# Patient Record
Sex: Female | Born: 1950 | Race: Black or African American | Hispanic: No | Marital: Married | Smoking: Never smoker
Health system: Southern US, Community
[De-identification: ages and names within clinical notes are randomized; demographics above are authoritative.]

## PROBLEM LIST (undated history)

## (undated) DIAGNOSIS — M1990 Unspecified osteoarthritis, unspecified site: Secondary | ICD-10-CM

## (undated) DIAGNOSIS — I10 Essential (primary) hypertension: Secondary | ICD-10-CM

## (undated) HISTORY — DX: Essential (primary) hypertension: I10

## (undated) HISTORY — PX: CYSTECTOMY: SUR359

## (undated) HISTORY — DX: Unspecified osteoarthritis, unspecified site: M19.90

---

## 2003-01-16 ENCOUNTER — Emergency Department (HOSPITAL_COMMUNITY): Admission: EM | Admit: 2003-01-16 | Discharge: 2003-01-16 | Payer: Self-pay | Admitting: Emergency Medicine

## 2005-03-19 ENCOUNTER — Encounter: Admission: RE | Admit: 2005-03-19 | Discharge: 2005-03-19 | Payer: Self-pay | Admitting: *Deleted

## 2012-05-08 ENCOUNTER — Telehealth: Payer: Self-pay | Admitting: Medical Oncology

## 2012-05-08 NOTE — Telephone Encounter (Signed)
Wrong chart

## 2019-03-11 LAB — COLOGUARD

## 2019-08-12 ENCOUNTER — Other Ambulatory Visit: Payer: Self-pay | Admitting: Physician Assistant

## 2019-08-12 ENCOUNTER — Other Ambulatory Visit: Payer: Self-pay

## 2019-08-13 ENCOUNTER — Ambulatory Visit (INDEPENDENT_AMBULATORY_CARE_PROVIDER_SITE_OTHER): Payer: Medicare HMO | Admitting: Physician Assistant

## 2019-08-13 ENCOUNTER — Encounter (INDEPENDENT_AMBULATORY_CARE_PROVIDER_SITE_OTHER): Payer: Self-pay

## 2019-08-13 ENCOUNTER — Encounter: Payer: Self-pay | Admitting: Physician Assistant

## 2019-08-13 VITALS — BP 103/70 | HR 75 | Temp 97.8°F | Ht 65.0 in | Wt 215.0 lb

## 2019-08-13 DIAGNOSIS — Z23 Encounter for immunization: Secondary | ICD-10-CM

## 2019-08-13 DIAGNOSIS — I10 Essential (primary) hypertension: Secondary | ICD-10-CM | POA: Diagnosis not present

## 2019-08-13 MED ORDER — LISINOPRIL 40 MG PO TABS: 40.0000 mg | ORAL_TABLET | Freq: Every day | ORAL | 1 refills | Status: DC

## 2019-08-13 MED ORDER — FUROSEMIDE 20 MG PO TABS: ORAL_TABLET | ORAL | 1 refills | Status: DC

## 2019-08-13 NOTE — Patient Instructions (Signed)

## 2019-08-13 NOTE — Progress Notes (Signed)
BP 103/70   Pulse 75   Temp 97.8 F (36.6 C) (Temporal)   Ht 5' 5"  (1.651 m)   Wt 215 lb (97.5 kg)   SpO2 98%   BMI 35.78 kg/m    Subjective:    Patient ID: Maria Hart, female    DOB: Feb 20, 1951, 68 y.o.   MRN: [id number]  HPI: Maria Hart is a 68 y.o. female presenting on 08/13/2019 for New Patient (Initial Visit) (Dr. Melina Copa)   Patient comes in with a new patient to be established.  She is a longtime patient of Dr. Melina Copa.  Her only medical condition of significance is hypertension.  She notes that she does have some arthritis in her knees that starts to bother her.  She has never had to take prescription medication for it.  She recently did go to the emergency room because of swelling in her legs, right greater than left.  She had had a couple of weeks where she was traveling a lot and riding in a car, sitting at the hospital, not eating well and not able to drink water like she normally does.  And she feels like this is probably what caused the swelling to occur.  They did give her Lasix at the emergency room and she is taking it daily since then.  Her blood pressure was slightly low today.  I have discussed that we need to change her medications around.  And rather than her taking lisinopril with hydrochlorothiazide in it.  We are going to have her use lisinopril alone with Lasix 20 mg she will take 1 daily and occasionally to when she has an episode of edema.  We will also have labs performed today.  We are getting her records in order to see the status of her vaccine record, labs, Pap result.  She did have a Pap performed earlier this year we can see that in the office note but do not have that result.  She also had a Cologuard test and it was negative in May 2020.  She will not be due another Cologuard until 3 years. Past Medical History:  Diagnosis Date  . Arthritis   . Hypertension    Relevant past medical, surgical, family and social history reviewed and updated  as indicated. Interim medical history since our last visit reviewed. Allergies and medications reviewed and updated. DATA REVIEWED: CHART IN EPIC  Family History reviewed for pertinent findings.  Review of Systems  Constitutional: Negative.   HENT: Negative.   Eyes: Negative.   Respiratory: Negative.   Gastrointestinal: Negative.   Genitourinary: Negative.   Musculoskeletal: Positive for arthralgias.    Allergies as of 08/13/2019   No Known Allergies     Medication List       Accurate as of August 13, 2019 10:54 AM. If you have any questions, ask your nurse or doctor.        STOP taking these medications   lisinopril-hydrochlorothiazide 20-12.5 MG tablet Commonly known as: ZESTORETIC Stopped by: Terald Sleeper, PA-C     TAKE these medications   furosemide 20 MG tablet Commonly known as: LASIX TAKE 1-2 TABLET BY MOUTH ONCE DAILY AS NEEDED FOR EDEMA What changed: See the new instructions. Changed by: Terald Sleeper, PA-C   lisinopril 40 MG tablet Commonly known as: ZESTRIL Take 1 tablet (40 mg total) by mouth daily. Started by: Terald Sleeper, PA-C          Objective:  BP 103/70   Pulse 75   Temp 97.8 F (36.6 C) (Temporal)   Ht 5' 5"  (1.651 m)   Wt 215 lb (97.5 kg)   SpO2 98%   BMI 35.78 kg/m   No Known Allergies  Wt Readings from Last 3 Encounters:  08/13/19 215 lb (97.5 kg)    Physical Exam Constitutional:      General: She is not in acute distress.    Appearance: Normal appearance. She is well-developed.  HENT:     Head: Normocephalic and atraumatic.  Cardiovascular:     Rate and Rhythm: Normal rate.  Pulmonary:     Effort: Pulmonary effort is normal.  Skin:    General: Skin is warm and dry.     Findings: No rash.  Neurological:     Mental Status: She is alert and oriented to person, place, and time.     Deep Tendon Reflexes: Reflexes are normal and symmetric.     No results found for this or any previous visit.    Assessment &  Plan:   1. Essential hypertension Lisinopril 40 mg 1 daily Furosemide 20 mg 1-2 daily - CMP14+EGFR  2. Need for immunization against influenza - Flu Vaccine QUAD High Dose(Fluad)   Continue all other maintenance medications as listed above.  Follow up plan: Return in about 6 months (around 02/11/2020).  Educational handout given for Mellette PA-C [address]  [place], [place] [address] [phone]   08/13/2019, 10:54 AM

## 2019-08-14 ENCOUNTER — Other Ambulatory Visit: Payer: Self-pay | Admitting: Physician Assistant

## 2019-08-14 DIAGNOSIS — R399 Unspecified symptoms and signs involving the genitourinary system: Secondary | ICD-10-CM

## 2019-08-14 LAB — CMP14+EGFR
ALT: 25 IU/L (ref 0–32)
AST: 22 IU/L (ref 0–40)
Albumin/Globulin Ratio: 1.7 (ref 1.2–2.2)
Albumin: 4.4 g/dL (ref 3.8–4.8)
Alkaline Phosphatase: 69 IU/L (ref 39–117)
BUN/Creatinine Ratio: 18 (ref 12–28)
BUN: 24 mg/dL (ref 8–27)
Bilirubin Total: 0.7 mg/dL (ref 0.0–1.2)
CO2: 24 mmol/L (ref 20–29)
Calcium: 9.5 mg/dL (ref 8.7–10.3)
Chloride: 102 mmol/L (ref 96–106)
Creatinine, Ser: 1.33 mg/dL — ABNORMAL HIGH (ref 0.57–1.00)
GFR calc Af Amer: 48 mL/min/{1.73_m2} — ABNORMAL LOW (ref >59)
GFR calc non Af Amer: 41 mL/min/{1.73_m2} — ABNORMAL LOW (ref >59)
Globulin, Total: 2.6 g/dL (ref 1.5–4.5)
Glucose: 99 mg/dL (ref 65–99)
Potassium: 4.1 mmol/L (ref 3.5–5.2)
Sodium: 140 mmol/L (ref 134–144)
Total Protein: 7 g/dL (ref 6.0–8.5)

## 2019-09-23 ENCOUNTER — Other Ambulatory Visit: Payer: Self-pay

## 2019-09-24 ENCOUNTER — Other Ambulatory Visit: Payer: Medicare HMO

## 2019-09-24 DIAGNOSIS — R399 Unspecified symptoms and signs involving the genitourinary system: Secondary | ICD-10-CM

## 2019-09-25 LAB — MICROALBUMIN / CREATININE URINE RATIO
Creatinine, Urine: 69 mg/dL
Microalb/Creat Ratio: <4 mg/g creat (ref 0–29)
Microalbumin, Urine: <3 ug/mL

## 2019-09-25 LAB — CMP14+EGFR
ALT: 19 IU/L (ref 0–32)
AST: 21 IU/L (ref 0–40)
Albumin/Globulin Ratio: 1.7 (ref 1.2–2.2)
Albumin: 4.1 g/dL (ref 3.8–4.8)
Alkaline Phosphatase: 58 IU/L (ref 39–117)
BUN/Creatinine Ratio: 14 (ref 12–28)
BUN: 16 mg/dL (ref 8–27)
Bilirubin Total: 0.6 mg/dL (ref 0.0–1.2)
CO2: 24 mmol/L (ref 20–29)
Calcium: 9.5 mg/dL (ref 8.7–10.3)
Chloride: 106 mmol/L (ref 96–106)
Creatinine, Ser: 1.14 mg/dL — ABNORMAL HIGH (ref 0.57–1.00)
GFR calc Af Amer: 57 mL/min/{1.73_m2} — ABNORMAL LOW (ref >59)
GFR calc non Af Amer: 50 mL/min/{1.73_m2} — ABNORMAL LOW (ref >59)
Globulin, Total: 2.4 g/dL (ref 1.5–4.5)
Glucose: 97 mg/dL (ref 65–99)
Potassium: 4.5 mmol/L (ref 3.5–5.2)
Sodium: 143 mmol/L (ref 134–144)
Total Protein: 6.5 g/dL (ref 6.0–8.5)

## 2019-12-14 ENCOUNTER — Ambulatory Visit: Payer: Medicare HMO | Attending: Internal Medicine

## 2019-12-14 ENCOUNTER — Other Ambulatory Visit: Payer: Self-pay

## 2019-12-14 DIAGNOSIS — Z23 Encounter for immunization: Secondary | ICD-10-CM | POA: Insufficient documentation

## 2019-12-14 NOTE — Progress Notes (Signed)
   Covid-19 Vaccination Clinic  Name:  Maria Hart    MRN: [id number] DOB: [birth year]  12/14/2019  Maria Hart was observed post Covid-19 immunization for 15 minutes without incidence. She was provided with Vaccine Information Sheet and instruction to access the V-Safe system.   Maria Hart was instructed to call 911 with any severe reactions post vaccine: Marland Kitchen Difficulty breathing  . Swelling of your face and throat  . A fast heartbeat  . A bad rash all over your body  . Dizziness and weakness    Immunizations Administered    Name Date Dose VIS Date Route   Moderna COVID-19 Vaccine 12/14/2019 12:25 PM 0.5 mL 10/01/2019 Intramuscular   Manufacturer: Moderna   Lot: [id number]   NDC: 22840-698-61

## 2020-01-11 ENCOUNTER — Ambulatory Visit: Payer: Medicare HMO | Attending: Internal Medicine

## 2020-01-11 DIAGNOSIS — Z23 Encounter for immunization: Secondary | ICD-10-CM

## 2020-01-11 NOTE — Progress Notes (Signed)
   Covid-19 Vaccination Clinic  Name:  Maria Hart    MRN: [id number] DOB: 1951-10-30  01/11/2020  Maria Hart was observed post Covid-19 immunization for 15 minutes without incident. She was provided with Vaccine Information Sheet and instruction to access the V-Safe system.   Maria Hart was instructed to call 911 with any severe reactions post vaccine: Marland Kitchen Difficulty breathing  . Swelling of face and throat  . A fast heartbeat  . A bad rash all over body  . Dizziness and weakness   Immunizations Administered    Name Date Dose VIS Date Route   Moderna COVID-19 Vaccine 01/11/2020 10:18 AM 0.5 mL 10/01/2019 Intramuscular   Manufacturer: Moderna   Lot: [id number]   NDC: 29244-628-63

## 2020-02-11 ENCOUNTER — Ambulatory Visit: Payer: Medicare HMO | Admitting: Physician Assistant

## 2020-02-21 ENCOUNTER — Ambulatory Visit: Payer: Medicare HMO

## 2020-02-24 ENCOUNTER — Ambulatory Visit (INDEPENDENT_AMBULATORY_CARE_PROVIDER_SITE_OTHER): Payer: Medicare HMO | Admitting: *Deleted

## 2020-02-24 DIAGNOSIS — Z0000 Encounter for general adult medical examination without abnormal findings: Secondary | ICD-10-CM | POA: Diagnosis not present

## 2020-02-24 NOTE — Progress Notes (Signed)
MEDICARE ANNUAL WELLNESS VISIT  02/24/2020  Telephone Visit Disclaimer This Medicare AWV was conducted by telephone due to national recommendations for restrictions regarding the COVID-19 Pandemic (e.g. social distancing).  I verified, using two identifiers, that I am speaking with Maria Hart or their authorized healthcare agent. I discussed the limitations, risks, security, and privacy concerns of performing an evaluation and management service by telephone and the potential availability of an in-person appointment in the future. The patient expressed understanding and agreed to proceed.   Subjective:  Maria Hart is a 69 y.o. female patient of Maria Sleeper, PA-C who had a Medicare Annual Wellness Visit today via telephone. Maria Hart is Retired and lives with their spouse. she has 1 children. she reports that she is socially active and does interact with friends/family regularly. she is minimally physically active and enjoys going to the beach, and cleaning.  Patient Care Team: Maria Hart as PCP - General (Physician Assistant)  Advanced Directives 02/24/2020  Does Patient Have a Medical Advance Directive? Yes  Type of Advance Directive Living will    Hospital Utilization Over the Past 12 Months: # of hospitalizations or ER visits: 0 # of surgeries: 0  Review of Systems    Patient reports that her overall health is unchanged compared to last year.  Right ankle pain and swelling  Patient Reported Readings (BP, Pulse, CBG, Weight, etc) none  Pain Assessment Pain : 0-10 Pain Score: 5  Pain Type: Acute pain Pain Location: Ankle Pain Orientation: Right Pain Descriptors / Indicators: Sore Pain Onset: In the past 7 days Pain Frequency: Occasional     Current Medications & Allergies (verified) Allergies as of 02/24/2020   No Known Allergies     Medication List       Accurate as of February 24, 2020  1:46 PM. If you have any questions, ask your nurse or  doctor.        furosemide 20 MG tablet Commonly known as: LASIX TAKE 1-2 TABLET BY MOUTH ONCE DAILY AS NEEDED FOR EDEMA   lisinopril 40 MG tablet Commonly known as: ZESTRIL Take 1 tablet (40 mg total) by mouth daily.       History (reviewed): Past Medical History:  Diagnosis Date  . Arthritis   . Hypertension    Past Surgical History:  Procedure Laterality Date  . CYSTECTOMY     Family History  Problem Relation Age of Onset  . Cancer Mother        Brain   . Arthritis Father   . Diabetes Father   . Hypertension Sister   . Hypertension Sister    Social History   Socioeconomic History  . Marital status: Married    Spouse name: Not on file  . Number of children: 1  . Years of education: Not on file  . Highest education level: High school graduate  Occupational History  . Occupation: Retired  Tobacco Use  . Smoking status: Never Smoker  . Smokeless tobacco: Never Used  Substance and Sexual Activity  . Alcohol use: Never  . Drug use: Never  . Sexual activity: Not Currently  Other Topics Concern  . Not on file  Social History Narrative  . Not on file   Social Determinants of Health   Financial Resource Strain:   . Difficulty of Paying Living Expenses:   Food Insecurity:   . Worried About Charity fundraiser in the Last Year:   . [employer] of  Food in the Last Year:   Transportation Needs:   . Freight forwarder (Medical):   Marland Kitchen Lack of Transportation (Non-Medical):   Physical Activity:   . Days of Exercise per Week:   . Minutes of Exercise per Session:   Stress:   . Feeling of Stress :   Social Connections:   . Frequency of Communication with Friends and Family:   . Frequency of Social Gatherings with Friends and Family:   . Attends Religious Services:   . Active Member of Clubs or Organizations:   . Attends Banker Meetings:   Marland Kitchen Marital Status:     Activities of Daily Living In your present state of health, do you have any  difficulty performing the following activities: 02/24/2020  Hearing? Y  Comment At times seems to have trouble  Vision? N  Comment Wears reading glasses  Difficulty concentrating or making decisions? N  Walking or climbing stairs? N  Dressing or bathing? N  Doing errands, shopping? N  Preparing Food and eating ? N  Using the Toilet? N  In the past six months, have you accidently leaked urine? N  Do you have problems with loss of bowel control? N  Managing your Medications? N  Managing your Finances? N  Housekeeping or managing your Housekeeping? N  Some recent data might be hidden    Patient Education/ Literacy How often do you need to have someone help you when you read instructions, pamphlets, or other written materials from your doctor or pharmacy?: 1 - Never What is the last grade level you completed in school?: 12  Exercise Current Exercise Habits: The patient does not participate in regular exercise at present  Diet Patient reports consuming 2 meals a day and 2 snack(s) a day Patient reports that her primary diet is: Regular Patient reports that she does have regular access to food.   Depression Screen PHQ 2/9 Scores 02/24/2020 08/13/2019  PHQ - 2 Score 0 0     Fall Risk Fall Risk  02/24/2020 08/13/2019  Falls in the past year? 0 0     Objective:  Maria Hart seemed alert and oriented and she participated appropriately during our telephone visit.  Blood Pressure Weight BMI  BP Readings from Last 3 Encounters:  08/13/19 103/70   Wt Readings from Last 3 Encounters:  08/13/19 215 lb (97.5 kg)   BMI Readings from Last 1 Encounters:  08/13/19 35.78 kg/m    *Unable to obtain current vital signs, weight, and BMI due to telephone visit type  Hearing/Vision  . Maria Hart did not seem to have difficulty with hearing/understanding during the telephone conversation . Reports that she has had a formal eye exam by an eye care professional within the past year . Reports  that she has not had a formal hearing evaluation within the past year *Unable to fully assess hearing and vision during telephone visit type  Cognitive Function: 6CIT Screen 02/24/2020  What Year? 0 points  What month? 0 points  What time? 0 points  Count back from 20 0 points  Months in reverse 0 points  Repeat phrase 2 points  Total Score 2   (Normal:0-7, Significant for Dysfunction: >8)  Normal Cognitive Function Screening: Yes   Immunization & Health Maintenance Record Immunization History  Administered Date(s) Administered  . Fluad Quad(high Dose 65+) 08/13/2019  . Moderna SARS-COVID-2 Vaccination 12/14/2019, 01/11/2020    Health Maintenance  Topic Date Due  . Hepatitis C Screening  Never done  .  TETANUS/TDAP  Never done  . MAMMOGRAM  Never done  . DEXA SCAN  Never done  . PNA vac Low Risk Adult (1 of 2 - PCV13) Never done  . INFLUENZA VACCINE  05/31/2020  . Fecal DNA (Cologuard)  03/06/2022  . COVID-19 Vaccine  Completed       Assessment  This is a routine wellness examination for Maria Hart.  Health Maintenance: Due or Overdue Health Maintenance Due  Topic Date Due  . Hepatitis C Screening  Never done  . TETANUS/TDAP  Never done  . MAMMOGRAM  Never done  . DEXA SCAN  Never done  . PNA vac Low Risk Adult (1 of 2 - PCV13) Never done    Maria Hart does not need a referral for Community Assistance: Care Management:   no Social Work:    no Prescription Assistance:  no Nutrition/Diabetes Education:  no   Plan:  Personalized Goals Goals Addressed            This Visit's Progress   . AWV       02/24/2020 AWV Goal: Exercise for General Health   Patient will verbalize understanding of the benefits of increased physical activity:  Exercising regularly is important. It will improve your overall fitness, flexibility, and endurance.  Regular exercise also will improve your overall health. It can help you control your weight, reduce stress,  and improve your bone density.  Over the next year, patient will increase physical activity as tolerated with a goal of at least 150 minutes of moderate physical activity per week.   You can tell that you are exercising at a moderate intensity if your heart starts beating faster and you start breathing faster but can still hold a conversation.  Moderate-intensity exercise ideas include:  Walking 1 mile (1.6 km) in about 15 minutes  Biking  Hiking  Golfing  Dancing  Water aerobics  Patient will verbalize understanding of everyday activities that increase physical activity by providing examples like the following: ? Yard work, such as: ? Pushing a Surveyor, mining ? Raking and bagging leaves ? Washing your car ? Pushing a stroller ? Shoveling snow ? Gardening ? Washing windows or floors  Patient will be able to explain general safety guidelines for exercising:   Before you start a new exercise program, talk with your health care provider.  Do not exercise so much that you hurt yourself, feel dizzy, or get very short of breath.  Wear comfortable clothes and wear shoes with good support.  Drink plenty of water while you exercise to prevent dehydration or heat stroke.  Work out until your breathing and your heartbeat get faster.       Personalized Health Maintenance & Screening Recommendations  Pneumococcal vaccine  Td vaccine Screening mammography Bone densitometry screening  Lung Cancer Screening Recommended: no (Low Dose CT Chest recommended if Age 19-80 years, 30 pack-year currently smoking OR have quit w/in past 15 years) Hepatitis C Screening recommended: no HIV Screening recommended: no  Advanced Directives: Written information was not prepared per patient's request.  Referrals & Orders No orders of the defined types were placed in this encounter.   Follow-up Plan . Follow-up with Dr. Nadine Counts as planned . We will discuss tdap and pneumonia vaccines at  appointment on Wednesday . AVS printed and mailed to patient     I have personally reviewed and noted the following in the patient's chart:   . Medical and social history . Use of alcohol,  tobacco or illicit drugs  . Current medications and supplements . Functional ability and status . Nutritional status . Physical activity . Advanced directives . List of other physicians . Hospitalizations, surgeries, and ER visits in previous 12 months . Vitals . Screenings to include cognitive, depression, and falls . Referrals and appointments  In addition, I have reviewed and discussed with Maria Hart certain preventive protocols, quality metrics, and best practice recommendations. A written personalized care plan for preventive services as well as general preventive health recommendations is available and can be mailed to the patient at her request.      Sherron Monday  02/24/2020

## 2020-02-25 NOTE — Patient Instructions (Signed)
  MEDICARE ANNUAL WELLNESS VISIT Health Maintenance Summary and Written Plan of Care  Maria Hart ,  Thank you for allowing me to perform your Medicare Annual Wellness Visit and for your ongoing commitment to your health.   Health Maintenance & Immunization History Health Maintenance  Topic Date Due  . Hepatitis C Screening  Never done  . TETANUS/TDAP  Never done  . MAMMOGRAM  Never done  . DEXA SCAN  Never done  . PNA vac Low Risk Adult (1 of 2 - PCV13) Never done  . INFLUENZA VACCINE  05/31/2020  . Fecal DNA (Cologuard)  03/06/2022  . COVID-19 Vaccine  Completed   Immunization History  Administered Date(s) Administered  . Fluad Quad(high Dose 65+) 08/13/2019  . Moderna SARS-COVID-2 Vaccination 12/14/2019, 01/11/2020    These are the patient goals that we discussed: Goals Addressed            This Visit's Progress   . AWV       02/24/2020 AWV Goal: Exercise for General Health   Patient will verbalize understanding of the benefits of increased physical activity:  Exercising regularly is important. It will improve your overall fitness, flexibility, and endurance.  Regular exercise also will improve your overall health. It can help you control your weight, reduce stress, and improve your bone density.  Over the next year, patient will increase physical activity as tolerated with a goal of at least 150 minutes of moderate physical activity per week.   You can tell that you are exercising at a moderate intensity if your heart starts beating faster and you start breathing faster but can still hold a conversation.  Moderate-intensity exercise ideas include:  Walking 1 mile (1.6 km) in about 15 minutes  Biking  Hiking  Golfing  Dancing  Water aerobics  Patient will verbalize understanding of everyday activities that increase physical activity by providing examples like the following: ? Yard work, such as: ? Pushing a Surveyor, mining ? Raking and bagging  leaves ? Washing your car ? Pushing a stroller ? Shoveling snow ? Gardening ? Washing windows or floors  Patient will be able to explain general safety guidelines for exercising:   Before you start a new exercise program, talk with your health care provider.  Do not exercise so much that you hurt yourself, feel dizzy, or get very short of breath.  Wear comfortable clothes and wear shoes with good support.  Drink plenty of water while you exercise to prevent dehydration or heat stroke.  Work out until your breathing and your heartbeat get faster.         This is a list of Health Maintenance Items that are overdue or due now: Health Maintenance Due  Topic Date Due  . Hepatitis C Screening  Never done  . TETANUS/TDAP  Never done  . MAMMOGRAM  Never done  . DEXA SCAN  Never done  . PNA vac Low Risk Adult (1 of 2 - PCV13) Never done     Orders/Referrals Placed Today: No orders of the defined types were placed in this encounter.  (Contact our referral department at [phone] if you have not spoken with someone about your referral appointment within the next 5 days)    Follow-up Plan Follow up with Dr. Nadine Counts as plannded We will discuss tdap and pneumonia vaccine at appointment.

## 2020-02-26 ENCOUNTER — Other Ambulatory Visit: Payer: Self-pay

## 2020-02-26 ENCOUNTER — Encounter: Payer: Self-pay | Admitting: Family Medicine

## 2020-02-26 ENCOUNTER — Ambulatory Visit (INDEPENDENT_AMBULATORY_CARE_PROVIDER_SITE_OTHER): Payer: Medicare HMO | Admitting: Family Medicine

## 2020-02-26 VITALS — BP 158/90 | HR 90 | Temp 98.0°F | Ht 65.0 in | Wt 217.0 lb

## 2020-02-26 DIAGNOSIS — I129 Hypertensive chronic kidney disease with stage 1 through stage 4 chronic kidney disease, or unspecified chronic kidney disease: Secondary | ICD-10-CM | POA: Diagnosis not present

## 2020-02-26 DIAGNOSIS — N183 Chronic kidney disease, stage 3 unspecified: Secondary | ICD-10-CM

## 2020-02-26 DIAGNOSIS — Z130 Encounter for screening for diseases of the blood and blood-forming organs and certain disorders involving the immune mechanism: Secondary | ICD-10-CM | POA: Diagnosis not present

## 2020-02-26 DIAGNOSIS — Z7689 Persons encountering health services in other specified circumstances: Secondary | ICD-10-CM

## 2020-02-26 DIAGNOSIS — Z1159 Encounter for screening for other viral diseases: Secondary | ICD-10-CM

## 2020-02-26 DIAGNOSIS — E8940 Asymptomatic postprocedural ovarian failure: Secondary | ICD-10-CM

## 2020-02-26 NOTE — Patient Instructions (Signed)
I may add a water pill called hydrochlorothiazide pending your lab. Come back in 1 week for BP check with nurse   DASH Eating Plan DASH stands for "Dietary Approaches to Stop Hypertension." The DASH eating plan is a healthy eating plan that has been shown to reduce high blood pressure (hypertension). It may also reduce your risk for type 2 diabetes, heart disease, and stroke. The DASH eating plan may also help with weight loss. What are tips for following this plan?  General guidelines  Avoid eating more than 2,300 mg (milligrams) of salt (sodium) a day. If you have hypertension, you may need to reduce your sodium intake to 1,500 mg a day.  Limit alcohol intake to no more than 1 drink a day for nonpregnant women and 2 drinks a day for men. One drink equals 12 oz of beer, 5 oz of wine, or 1 oz of hard liquor.  Work with your health care provider to maintain a healthy body weight or to lose weight. Ask what an ideal weight is for you.  Get at least 30 minutes of exercise that causes your heart to beat faster (aerobic exercise) most days of the week. Activities may include walking, swimming, or biking.  Work with your health care provider or diet and nutrition specialist (dietitian) to adjust your eating plan to your individual calorie needs. Reading food labels   Check food labels for the amount of sodium per serving. Choose foods with less than 5 percent of the Daily Value of sodium. Generally, foods with less than 300 mg of sodium per serving fit into this eating plan.  To find whole grains, look for the word "whole" as the first word in the ingredient list. Shopping  Buy products labeled as "low-sodium" or "no salt added."  Buy fresh foods. Avoid canned foods and premade or frozen meals. Cooking  Avoid adding salt when cooking. Use salt-free seasonings or herbs instead of table salt or sea salt. Check with your health care provider or pharmacist before using salt substitutes.  Do  not fry foods. Cook foods using healthy methods such as baking, boiling, grilling, and broiling instead.  Cook with heart-healthy oils, such as olive, canola, soybean, or sunflower oil. Meal planning  Eat a balanced diet that includes: ? 5 or more servings of fruits and vegetables each day. At each meal, try to fill half of your plate with fruits and vegetables. ? Up to 6-8 servings of whole grains each day. ? Less than 6 oz of lean meat, poultry, or fish each day. A 3-oz serving of meat is about the same size as a deck of cards. One egg equals 1 oz. ? 2 servings of low-fat dairy each day. ? A serving of nuts, seeds, or beans 5 times each week. ? Heart-healthy fats. Healthy fats called Omega-3 fatty acids are found in foods such as flaxseeds and coldwater fish, like sardines, salmon, and mackerel.  Limit how much you eat of the following: ? Canned or prepackaged foods. ? Food that is high in trans fat, such as fried foods. ? Food that is high in saturated fat, such as fatty meat. ? Sweets, desserts, sugary drinks, and other foods with added sugar. ? Full-fat dairy products.  Do not salt foods before eating.  Try to eat at least 2 vegetarian meals each week.  Eat more home-cooked food and less restaurant, buffet, and fast food.  When eating at a restaurant, ask that your food be prepared with less salt  or no salt, if possible. What foods are recommended? The items listed may not be a complete list. Talk with your dietitian about what dietary choices are best for you. Grains Whole-grain or whole-wheat bread. Whole-grain or whole-wheat pasta. Brown rice. Maria Hart. Bulgur. Whole-grain and low-sodium cereals. Pita bread. Low-fat, low-sodium crackers. Whole-wheat flour tortillas. Vegetables Fresh or frozen vegetables (raw, steamed, roasted, or grilled). Low-sodium or reduced-sodium tomato and vegetable juice. Low-sodium or reduced-sodium tomato sauce and tomato paste. Low-sodium or  reduced-sodium canned vegetables. Fruits All fresh, dried, or frozen fruit. Canned fruit in natural juice (without added sugar). Meat and other protein foods Skinless chicken or [place]. Ground chicken or [place]. Pork with fat trimmed off. Fish and seafood. Egg whites. Dried beans, peas, or lentils. Unsalted nuts, nut butters, and seeds. Unsalted canned beans. Lean cuts of beef with fat trimmed off. Low-sodium, lean deli meat. Dairy Low-fat (1%) or fat-free (skim) milk. Fat-free, low-fat, or reduced-fat cheeses. Nonfat, low-sodium ricotta or cottage cheese. Low-fat or nonfat yogurt. Low-fat, low-sodium cheese. Fats and oils Soft margarine without trans fats. Vegetable oil. Low-fat, reduced-fat, or light mayonnaise and salad dressings (reduced-sodium). Canola, safflower, olive, soybean, and sunflower oils. Avocado. Seasoning and other foods Herbs. Spices. Seasoning mixes without salt. Unsalted popcorn and pretzels. Fat-free sweets. What foods are not recommended? The items listed may not be a complete list. Talk with your dietitian about what dietary choices are best for you. Grains Baked goods made with fat, such as croissants, muffins, or some breads. Dry pasta or rice meal packs. Vegetables Creamed or fried vegetables. Vegetables in a cheese sauce. Regular canned vegetables (not low-sodium or reduced-sodium). Regular canned tomato sauce and paste (not low-sodium or reduced-sodium). Regular tomato and vegetable juice (not low-sodium or reduced-sodium). Maria Hart. Olives. Fruits Canned fruit in a light or heavy syrup. Fried fruit. Fruit in cream or butter sauce. Meat and other protein foods Fatty cuts of meat. Ribs. Fried meat. Maria Hart. Sausage. Bologna and other processed lunch meats. Salami. Fatback. Hotdogs. Bratwurst. Salted nuts and seeds. Canned beans with added salt. Canned or smoked fish. Whole eggs or egg yolks. Chicken or [place] with skin. Dairy Whole or 2% milk, cream, and half-and-half.  Whole or full-fat cream cheese. Whole-fat or sweetened yogurt. Full-fat cheese. Nondairy creamers. Whipped toppings. Processed cheese and cheese spreads. Fats and oils Butter. Stick margarine. Lard. Shortening. Ghee. Bacon fat. Tropical oils, such as coconut, palm kernel, or palm oil. Seasoning and other foods Salted popcorn and pretzels. Onion salt, garlic salt, seasoned salt, table salt, and sea salt. Worcestershire sauce. Tartar sauce. Barbecue sauce. Teriyaki sauce. Soy sauce, including reduced-sodium. Steak sauce. Canned and packaged gravies. Fish sauce. Oyster sauce. Cocktail sauce. Horseradish that you find on the shelf. Ketchup. Mustard. Meat flavorings and tenderizers. Bouillon cubes. Hot sauce and Tabasco sauce. Premade or packaged marinades. Premade or packaged taco seasonings. Relishes. Regular salad dressings. Where to find more information:  National Heart, Lung, and [place]: https://wilson-eaton.com/  American Heart Association: www.heart.org Summary  The DASH eating plan is a healthy eating plan that has been shown to reduce high blood pressure (hypertension). It may also reduce your risk for type 2 diabetes, heart disease, and stroke.  With the DASH eating plan, you should limit salt (sodium) intake to 2,300 mg a day. If you have hypertension, you may need to reduce your sodium intake to 1,500 mg a day.  When on the DASH eating plan, aim to eat more fresh fruits and vegetables, whole grains, lean proteins, low-fat dairy,  and heart-healthy fats.  Work with your health care provider or diet and nutrition specialist (dietitian) to adjust your eating plan to your individual calorie needs. This information is not intended to replace advice given to you by your health care provider. Make sure you discuss any questions you have with your health care provider. Document Revised: 09/29/2017 Document Reviewed: 10/10/2016 Elsevier Patient Education  2020 [employer].

## 2020-02-26 NOTE — Progress Notes (Signed)
Subjective: CC: est care, HTN, CKD3 PCP: [name], DO ZOX:WRUEAV Z Dimaio is a 69 y.o. female presenting to clinic today for:  1. HTN w/ CKD3 Patient reports compliance with Lisinopril 40mg  daily.  No chest pain, shortness of breath, dizziness or visual disturbance.  She does have chronic right greater than left lower extremity edema that resolves each morning but recurs each evening.  She takes Lasix intermittently for this but is trying to come off of this medicine as it did cause an AKI in the past.  She also does not find it as effective as it used to be.  Never used hydrochlorothiazide.  Blood pressures are typically this high.  She does admit to eating quite a bit of salt and would like recommendations on how to make dietary changes for improved blood pressure and weight.   ROS: Per HPI  No Known Allergies Past Medical History:  Diagnosis Date  . Arthritis   . Hypertension     Current Outpatient Medications:  .  furosemide (LASIX) 20 MG tablet, TAKE 1-2 TABLET BY MOUTH ONCE DAILY AS NEEDED FOR EDEMA, Disp: 180 tablet, Rfl: 1 .  lisinopril (ZESTRIL) 40 MG tablet, Take 1 tablet (40 mg total) by mouth daily., Disp: 90 tablet, Rfl: 1 Social History   Socioeconomic History  . Marital status: Married    Spouse name: Not on file  . Number of children: 1  . Years of education: Not on file  . Highest education level: High school graduate  Occupational History  . Occupation: Retired  Tobacco Use  . Smoking status: Never Smoker  . Smokeless tobacco: Never Used  Substance and Sexual Activity  . Alcohol use: Never  . Drug use: Never  . Sexual activity: Not Currently  Other Topics Concern  . Not on file  Social History Narrative  . Not on file   Social Determinants of Health   Financial Resource Strain:   . Difficulty of Paying Living Expenses:   Food Insecurity:   . Worried About in the Last Year:   . Programme researcher, broadcasting/film/video in the Last Year:    Transportation Needs:   . Barista (Medical):   Freight forwarder Lack of Transportation (Non-Medical):   Physical Activity:   . Days of Exercise per Week:   . Minutes of Exercise per Session:   Stress:   . Feeling of Stress :   Social Connections:   . Frequency of Communication with Friends and Family:   . Frequency of Social Gatherings with Friends and Family:   . Attends Religious Services:   . Active Member of Clubs or Organizations:   . Attends [employer] Meetings:   Banker Marital Status:   Intimate Partner Violence:   . Fear of Current or Ex-Partner:   . Emotionally Abused:   [employer]land [employer] Physically Abused:   . Sexually Abused:    Family History  Problem Relation Age of Onset  . Cancer Mother        Brain   . Arthritis Father   . Diabetes Father   . Hypertension Sister   . Hypertension Sister     Objective: Office vital signs reviewed. BP (!) 158/90   Pulse 90   Temp 98 F (36.7 C) (Temporal)   Ht 5\' 5"  (1.651 m)   Wt 217 lb (98.4 kg)   SpO2 98%   BMI 36.11 kg/m   Physical Examination:  General: Awake, alert, well nourished, well appearing  female. No acute distress HEENT: Normal, sclera white, MMM Cardio: regular rate and rhythm, S1S2 heard, no murmurs appreciated Pulm: clear to auscultation bilaterally, no wheezes, rhonchi or rales; normal work of breathing on room air Extremities: warm, well perfused, 1+ pitting edema right ankle and trace to left ankle, No cyanosis or clubbing; +2 pulses bilaterally MSK: normal gait and station  Assessment/ Plan: Ms Deremer is a very pleasant 69 y.o. female   1. Benign hypertension with CKD (chronic kidney disease) stage III Blood pressure is not well controlled today but may be situational anxiety response given new provider and first time meeting.  I would like her to come back in about 1 week to have her blood pressure rechecked with the nurse.  I am going to check a renal function panel, CBC.  May need to consider  addition of hydrochlorothiazide if she is not able to improve swelling with diet modification alone.  DASH diet was provided to her today. - CBC - Renal Function Panel  2. Screening, anemia, deficiency, iron - CBC  3. Encounter for hepatitis C screening test for low risk patient - Hepatitis C antibody  4. Establishing care with new doctor, encounter for   No orders of the defined types were placed in this encounter.  No orders of the defined types were placed in this encounter.    Janora Norlander, DO [place] [phone]

## 2020-02-27 ENCOUNTER — Encounter: Payer: Self-pay | Admitting: Family Medicine

## 2020-02-27 ENCOUNTER — Other Ambulatory Visit: Payer: Self-pay | Admitting: *Deleted

## 2020-02-27 LAB — RENAL FUNCTION PANEL
Albumin: 4.4 g/dL (ref 3.8–4.8)
BUN/Creatinine Ratio: 15 (ref 12–28)
BUN: 18 mg/dL (ref 8–27)
CO2: 25 mmol/L (ref 20–29)
Calcium: 9.7 mg/dL (ref 8.7–10.3)
Chloride: 104 mmol/L (ref 96–106)
Creatinine, Ser: 1.17 mg/dL — ABNORMAL HIGH (ref 0.57–1.00)
GFR calc Af Amer: 56 mL/min/{1.73_m2} — ABNORMAL LOW (ref >59)
GFR calc non Af Amer: 48 mL/min/{1.73_m2} — ABNORMAL LOW (ref >59)
Glucose: 101 mg/dL — ABNORMAL HIGH (ref 65–99)
Phosphorus: 3 mg/dL (ref 3.0–4.3)
Potassium: 4.7 mmol/L (ref 3.5–5.2)
Sodium: 142 mmol/L (ref 134–144)

## 2020-02-27 LAB — CBC
Hematocrit: 39.8 % (ref 34.0–46.6)
Hemoglobin: 12.8 g/dL (ref 11.1–15.9)
MCH: 29.1 pg (ref 26.6–33.0)
MCHC: 32.2 g/dL (ref 31.5–35.7)
MCV: 91 fL (ref 79–97)
Platelets: 310 10*3/uL (ref 150–450)
RBC: 4.4 x10E6/uL (ref 3.77–5.28)
RDW: 12.6 % (ref 11.7–15.4)
WBC: 9.3 10*3/uL (ref 3.4–10.8)

## 2020-02-27 LAB — HEPATITIS C ANTIBODY: Hep C Virus Ab: 0.1 s/co ratio (ref 0.0–0.9)

## 2020-02-27 MED ORDER — LISINOPRIL 40 MG PO TABS: 40.0000 mg | ORAL_TABLET | Freq: Every day | ORAL | 1 refills | Status: DC

## 2020-03-04 ENCOUNTER — Other Ambulatory Visit: Payer: Self-pay | Admitting: Family Medicine

## 2020-03-04 ENCOUNTER — Ambulatory Visit (INDEPENDENT_AMBULATORY_CARE_PROVIDER_SITE_OTHER): Payer: Medicare HMO | Admitting: *Deleted

## 2020-03-04 ENCOUNTER — Other Ambulatory Visit: Payer: Self-pay

## 2020-03-04 VITALS — BP 153/78 | HR 82

## 2020-03-04 DIAGNOSIS — I129 Hypertensive chronic kidney disease with stage 1 through stage 4 chronic kidney disease, or unspecified chronic kidney disease: Secondary | ICD-10-CM

## 2020-03-04 DIAGNOSIS — N183 Chronic kidney disease, stage 3 unspecified: Secondary | ICD-10-CM

## 2020-03-04 MED ORDER — HYDROCHLOROTHIAZIDE 25 MG PO TABS: 12.5000 mg | ORAL_TABLET | Freq: Every day | ORAL | 0 refills | Status: DC

## 2020-03-04 NOTE — Addendum Note (Signed)
Addended by: Adella Hare B on: 03/04/2020 11:50 AM   Modules accepted: Level of Service

## 2020-03-04 NOTE — Progress Notes (Signed)
Patient in today for BP check. First reading 152/82, second reading 153/78. Advised patient will call if MD wants to make any changes. Patient agreeable.

## 2020-04-23 ENCOUNTER — Encounter: Payer: Self-pay | Admitting: Family Medicine

## 2020-04-23 ENCOUNTER — Ambulatory Visit (INDEPENDENT_AMBULATORY_CARE_PROVIDER_SITE_OTHER): Payer: Medicare HMO

## 2020-04-23 ENCOUNTER — Other Ambulatory Visit: Payer: Self-pay

## 2020-04-23 ENCOUNTER — Ambulatory Visit (INDEPENDENT_AMBULATORY_CARE_PROVIDER_SITE_OTHER): Payer: Medicare HMO | Admitting: Family Medicine

## 2020-04-23 VITALS — BP 134/81 | HR 98 | Temp 97.7°F | Ht 65.0 in | Wt 220.0 lb

## 2020-04-23 DIAGNOSIS — M25562 Pain in left knee: Secondary | ICD-10-CM

## 2020-04-23 MED ORDER — MELOXICAM 7.5 MG PO TABS: 7.5000 mg | ORAL_TABLET | Freq: Every day | ORAL | 0 refills | Status: DC

## 2020-04-23 NOTE — Progress Notes (Signed)
Assessment & Plan:  1. Acute pain of left knee - Knee exercises provided. Encouraged use of Voltaren gel more frequently. Oral NSAIDs short term.  - meloxicam (MOBIC) 7.5 MG tablet; Take 1 tablet (7.5 mg total) by mouth daily.  Dispense: 14 tablet; Refill: 0 - DG Knee 1-2 Views Left   Follow up plan: Return if symptoms worsen or fail to improve.  Deliah Boston, MSN, APRN, FNP-C [facility]  Subjective:   Patient ID: Maria Hart, female    DOB: 05-20-1951, 69 y.o.   MRN: [id number]  HPI: Maria Hart is a 69 y.o. female presenting on 04/23/2020 for Knee Pain (Patient states that she has has left knee pain for a few months but it got worse yesterday.)  Knee Pain: Patient presents with knee pain involving the left knee. Onset of the symptoms was several months ago. Inciting event: none known. Current symptoms include giving out, pain located medially, stiffness and swelling. Pain is aggravated by lots of housework or sitting for long periods of time.  Patient has had no prior knee problems. Evaluation to date: x-rays a long time ago. Treatment to date: joint muscle rub which has not been effective.   ROS: Negative unless specifically indicated above in HPI.   Relevant past medical history reviewed and updated as indicated.   Allergies and medications reviewed and updated.   Current Outpatient Medications:  .  hydrochlorothiazide (HYDRODIURIL) 25 MG tablet, Take 0.5-1 tablets (12.5-25 mg total) by mouth daily. For blood pressure and swelling, Disp: 90 tablet, Rfl: 0 .  lisinopril (ZESTRIL) 40 MG tablet, Take 1 tablet (40 mg total) by mouth daily., Disp: 90 tablet, Rfl: 1  No Known Allergies  Objective:   BP 134/81   Pulse 98   Temp 97.7 F (36.5 C) (Temporal)   Ht 5\' 5"  (1.651 m)   Wt 220 lb (99.8 kg)   SpO2 100%   BMI 36.61 kg/m    Physical Exam Vitals reviewed.  Constitutional:      General: She is not in acute distress.    Appearance:  Normal appearance. She is obese. She is not ill-appearing, toxic-appearing or diaphoretic.  HENT:     Head: Normocephalic and atraumatic.  Eyes:     General: No scleral icterus.       Right eye: No discharge.        Left eye: No discharge.     Conjunctiva/sclera: Conjunctivae normal.  Cardiovascular:     Rate and Rhythm: Normal rate.  Pulmonary:     Effort: Pulmonary effort is normal. No respiratory distress.  Musculoskeletal:        General: Normal range of motion.     Cervical back: Normal range of motion.     Left knee: No deformity, effusion, erythema, ecchymosis, lacerations, bony tenderness or crepitus. Normal range of motion. Tenderness present. No LCL laxity, MCL laxity, ACL laxity or PCL laxity.Normal alignment, normal meniscus and normal patellar mobility. Normal pulse.     Instability Tests: Anterior drawer test negative. Posterior drawer test negative. Anterior Lachman test negative. Medial McMurray test negative and lateral McMurray test negative.       Legs:     Comments: Area if tenderness  Skin:    General: Skin is warm and dry.     Capillary Refill: Capillary refill takes less than 2 seconds.  Neurological:     General: No focal deficit present.     Mental Status: She is alert and oriented to  person, place, and time. Mental status is at baseline.  Psychiatric:        Mood and Affect: Mood normal.        Behavior: Behavior normal.        Thought Content: Thought content normal.        Judgment: Judgment normal.

## 2020-04-23 NOTE — Patient Instructions (Signed)
Voltaren gel as needed.   Journal for Nurse Practitioners, 15(4), [phone]. Retrieved August 06, 2018 from http://clinicalkey.com/nursing">  Knee Exercises Ask your health care provider which exercises are safe for you. Do exercises exactly as told by your health care provider and adjust them as directed. It is normal to feel mild stretching, pulling, tightness, or discomfort as you do these exercises. Stop right away if you feel sudden pain or your pain gets worse. Do not begin these exercises until told by your health care provider. Stretching and range-of-motion exercises These exercises warm up your muscles and joints and improve the movement and flexibility of your knee. These exercises also help to relieve pain and swelling. Knee extension, prone 1. Lie on your abdomen (prone position) on a bed. 2. Place your left / right knee just beyond the edge of the surface so your knee is not on the bed. You can put a towel under your left / right thigh just above your kneecap for comfort. 3. Relax your leg muscles and allow gravity to straighten your knee (extension). You should feel a stretch behind your left / right knee. 4. Hold this position for __________ seconds. 5. Scoot up so your knee is supported between repetitions. Repeat __________ times. Complete this exercise __________ times a day. Knee flexion, active  1. Lie on your back with both legs straight. If this causes back discomfort, bend your left / right knee so your foot is flat on the floor. 2. Slowly slide your left / right heel back toward your buttocks. Stop when you feel a gentle stretch in the front of your knee or thigh (flexion). 3. Hold this position for __________ seconds. 4. Slowly slide your left / right heel back to the starting position. Repeat __________ times. Complete this exercise __________ times a day. Quadriceps stretch, prone  1. Lie on your abdomen on a firm surface, such as a bed or padded floor. 2. Bend your  left / right knee and hold your ankle. If you cannot reach your ankle or pant leg, loop a belt around your foot and grab the belt instead. 3. Gently pull your heel toward your buttocks. Your knee should not slide out to the side. You should feel a stretch in the front of your thigh and knee (quadriceps). 4. Hold this position for __________ seconds. Repeat __________ times. Complete this exercise __________ times a day. Hamstring, supine 1. Lie on your back (supine position). 2. Loop a belt or towel over the ball of your left / right foot. The ball of your foot is on the walking surface, right under your toes. 3. Straighten your left / right knee and slowly pull on the belt to raise your leg until you feel a gentle stretch behind your knee (hamstring). ? Do not let your knee bend while you do this. ? Keep your other leg flat on the floor. 4. Hold this position for __________ seconds. Repeat __________ times. Complete this exercise __________ times a day. Strengthening exercises These exercises build strength and endurance in your knee. Endurance is the ability to use your muscles for a long time, even after they get tired. Quadriceps, isometric This exercise stretches the muscles in front of your thigh (quadriceps) without moving your knee joint (isometric). 1. Lie on your back with your left / right leg extended and your other knee bent. Put a rolled towel or small pillow under your knee if told by your health care provider. 2. Slowly tense the muscles in  the front of your left / right thigh. You should see your kneecap slide up toward your hip or see increased dimpling just above the knee. This motion will push the back of the knee toward the floor. 3. For __________ seconds, hold the muscle as tight as you can without increasing your pain. 4. Relax the muscles slowly and completely. Repeat __________ times. Complete this exercise __________ times a day. Straight leg raises This exercise  stretches the muscles in front of your thigh (quadriceps) and the muscles that move your hips (hip flexors). 1. Lie on your back with your left / right leg extended and your other knee bent. 2. Tense the muscles in the front of your left / right thigh. You should see your kneecap slide up or see increased dimpling just above the knee. Your thigh may even shake a bit. 3. Keep these muscles tight as you raise your leg 4-6 inches (10-15 cm) off the floor. Do not let your knee bend. 4. Hold this position for __________ seconds. 5. Keep these muscles tense as you lower your leg. 6. Relax your muscles slowly and completely after each repetition. Repeat __________ times. Complete this exercise __________ times a day. Hamstring, isometric 1. Lie on your back on a firm surface. 2. Bend your left / right knee about __________ degrees. 3. Dig your left / right heel into the surface as if you are trying to pull it toward your buttocks. Tighten the muscles in the back of your thighs (hamstring) to "dig" as hard as you can without increasing any pain. 4. Hold this position for __________ seconds. 5. Release the tension gradually and allow your muscles to relax completely for __________ seconds after each repetition. Repeat __________ times. Complete this exercise __________ times a day. Hamstring curls If told by your health care provider, do this exercise while wearing ankle weights. Begin with __________ lb weights. Then increase the weight by 1 lb (0.5 kg) increments. Do not wear ankle weights that are more than __________ lb. 1. Lie on your abdomen with your legs straight. 2. Bend your left / right knee as far as you can without feeling pain. Keep your hips flat against the floor. 3. Hold this position for __________ seconds. 4. Slowly lower your leg to the starting position. Repeat __________ times. Complete this exercise __________ times a day. Squats This exercise strengthens the muscles in front of  your thigh and knee (quadriceps). 1. Stand in front of a table, with your feet and knees pointing straight ahead. You may rest your hands on the table for balance but not for support. 2. Slowly bend your knees and lower your hips like you are going to sit in a chair. ? Keep your weight over your heels, not over your toes. ? Keep your lower legs upright so they are parallel with the table legs. ? Do not let your hips go lower than your knees. ? Do not bend lower than told by your health care provider. ? If your knee pain increases, do not bend as low. 3. Hold the squat position for __________ seconds. 4. Slowly push with your legs to return to standing. Do not use your hands to pull yourself to standing. Repeat __________ times. Complete this exercise __________ times a day. Wall slides This exercise strengthens the muscles in front of your thigh and knee (quadriceps). 1. Lean your back against a smooth wall or door, and walk your feet out 18-24 inches (46-61 cm) from it. 2.  Place your feet hip-width apart. 3. Slowly slide down the wall or door until your knees bend __________ degrees. Keep your knees over your heels, not over your toes. Keep your knees in line with your hips. 4. Hold this position for __________ seconds. Repeat __________ times. Complete this exercise __________ times a day. Straight leg raises This exercise strengthens the muscles that rotate the leg at the hip and move it away from your body (hip abductors). 1. Lie on your side with your left / right leg in the top position. Lie so your head, shoulder, knee, and hip line up. You may bend your bottom knee to help you keep your balance. 2. Roll your hips slightly forward so your hips are stacked directly over each other and your left / right knee is facing forward. 3. Leading with your heel, lift your top leg 4-6 inches (10-15 cm). You should feel the muscles in your outer hip lifting. ? Do not let your foot drift  forward. ? Do not let your knee roll toward the ceiling. 4. Hold this position for __________ seconds. 5. Slowly return your leg to the starting position. 6. Let your muscles relax completely after each repetition. Repeat __________ times. Complete this exercise __________ times a day. Straight leg raises This exercise stretches the muscles that move your hips away from the front of the pelvis (hip extensors). 1. Lie on your abdomen on a firm surface. You can put a pillow under your hips if that is more comfortable. 2. Tense the muscles in your buttocks and lift your left / right leg about 4-6 inches (10-15 cm). Keep your knee straight as you lift your leg. 3. Hold this position for __________ seconds. 4. Slowly lower your leg to the starting position. 5. Let your leg relax completely after each repetition. Repeat __________ times. Complete this exercise __________ times a day. This information is not intended to replace advice given to you by your health care provider. Make sure you discuss any questions you have with your health care provider. Document Revised: 08/07/2018 Document Reviewed: 08/07/2018 Elsevier Patient Education  2020 [employer].

## 2020-04-27 ENCOUNTER — Other Ambulatory Visit: Payer: Self-pay | Admitting: Family Medicine

## 2020-04-27 DIAGNOSIS — M25562 Pain in left knee: Secondary | ICD-10-CM

## 2020-04-27 DIAGNOSIS — T148XXA Other injury of unspecified body region, initial encounter: Secondary | ICD-10-CM

## 2020-05-07 ENCOUNTER — Other Ambulatory Visit: Payer: Self-pay

## 2020-05-07 ENCOUNTER — Ambulatory Visit (INDEPENDENT_AMBULATORY_CARE_PROVIDER_SITE_OTHER): Payer: Medicare HMO | Admitting: Orthopaedic Surgery

## 2020-05-07 ENCOUNTER — Encounter: Payer: Self-pay | Admitting: Orthopaedic Surgery

## 2020-05-07 DIAGNOSIS — M1711 Unilateral primary osteoarthritis, right knee: Secondary | ICD-10-CM

## 2020-05-07 DIAGNOSIS — M1712 Unilateral primary osteoarthritis, left knee: Secondary | ICD-10-CM

## 2020-05-07 DIAGNOSIS — M170 Bilateral primary osteoarthritis of knee: Secondary | ICD-10-CM

## 2020-05-07 MED ORDER — BUPIVACAINE HCL 0.5 % IJ SOLN
3.0000 mL | INTRAMUSCULAR | Status: AC | PRN
  Administered 2020-05-07: 3 mL via INTRA_ARTICULAR

## 2020-05-07 MED ORDER — LIDOCAINE HCL 1 % IJ SOLN
0.5000 mL | INTRAMUSCULAR | Status: AC | PRN
  Administered 2020-05-07: .5 mL

## 2020-05-07 MED ORDER — METHYLPREDNISOLONE ACETATE 40 MG/ML IJ SUSP
40.0000 mg | INTRAMUSCULAR | Status: AC | PRN
Start: 2020-05-07 — End: 2020-05-07
  Administered 2020-05-07: 40 mg via INTRA_ARTICULAR

## 2020-05-07 NOTE — Progress Notes (Signed)
Office Visit Note   Patient: Maria Hart           Date of Birth: 07/24/1951           MRN: [id number] Visit Date: 05/07/2020              Requested by: [name], FNP [address] [place],  [place] [address] PCP: [name], DO   Assessment & Plan: Visit Diagnoses:  1. Bilateral primary osteoarthritis of knee     Plan: We reviewed her x-rays she has more right knee osteoarthritis and left primarily lateral compartment.  She has some calcification of the femoral attachment lateral collateral ligament I discussed there this does not appear to be a fracture.  Intra-articular injection performed which she tolerated well.  I will recheck her again in about a month..  Follow-Up Instructions: No follow-ups on file.   Orders:  Orders Placed This Encounter  Procedures  . Large Joint Inj: L knee   No orders of the defined types were placed in this encounter.     Procedures: Large Joint Inj: L knee on 05/07/2020 10:41 AM Indications: joint swelling and pain Details: 22 G 1.5 in needle, anterolateral approach  Arthrogram: No  Medications: 0.5 mL lidocaine 1 %; 3 mL bupivacaine 0.5 %; 40 mg methylPREDNISolone acetate 40 MG/ML Outcome: tolerated well, no immediate complications Procedure, treatment alternatives, risks and benefits explained, specific risks discussed. Consent was given by the patient. Immediately prior to procedure a time out was called to verify the correct patient, procedure, equipment, support staff and site/side marked as required. Patient was prepped and draped in the usual sterile fashion.       Clinical Data: No additional findings.   Subjective: Chief Complaint  Patient presents with  . Left Knee - Pain    HPI 69 year old female seen with bilateral knee pain much worse on the left than right with times when she had difficulty walking.  She taken some meloxicam states the swelling is helped some but she still has pain in her knee and  occasional grabbing or catching.  Pain is medial lateral joint line.  She had previous x-rays obtained 2 weeks ago read as possible avulsion fracture lateral condyle of the femur.  Patient had worse degenerative changes on the right than left knee with the lateral osteophytes.  Patient denies any falls turning twisting or any other injuries to her knee.  Review of Systems positive for borderline elevation creatinine with hypertension.  Patient has been on Mobic recently and also takes medicine for blood pressure Zestril and HCTZ.  Otherwise 14 point systems are noncontributory.   Objective: Vital Signs: BP (!) 153/98   Pulse 85   Ht 5\' 5"  (1.651 m)   Wt 220 lb (99.8 kg)   BMI 36.61 kg/m   Physical Exam Constitutional:      Appearance: She is well-developed.  HENT:     Head: Normocephalic.     Right Ear: External ear normal.     Left Ear: External ear normal.  Eyes:     Pupils: Pupils are equal, round, and reactive to light.  Neck:     Thyroid: No thyromegaly.     Trachea: No tracheal deviation.  Cardiovascular:     Rate and Rhythm: Normal rate.  Pulmonary:     Effort: Pulmonary effort is normal.  Abdominal:     Palpations: Abdomen is soft.  Skin:    General: Skin is warm and dry.  Neurological:     Mental Status: She is alert and oriented to person, place, and time.  Psychiatric:        Behavior: Behavior normal.     Ortho Exam patient has bilateral 2+ knee effusion negative logroll the hips knees reach full extension significant crepitus right and left knee.  Collateral ligaments are stable.  Pain with patellofemoral loading and quadriceps contracture negative patellar subluxation ACL PCL exam is normal right and left.  Specialty Comments:  No specialty comments available.  Imaging: Narrative & Impression  CLINICAL DATA:  LEFT knee pain.  EXAM: LEFT KNEE - 1-2 VIEW  COMPARISON:  None.  FINDINGS: There is a bone density adjacent to LATERAL condyles of the  femur, possibly indicating an avulsion fracture. No joint effusion present. There are significant degenerative changes in the RIGHT knee with LATERAL joint space narrowing and osteophyte formation.  IMPRESSION: 1. Possible avulsion fracture of the LATERAL condyle of the femur. 2. Significant degenerative changes in the RIGHT knee.   Electronically Signed   By: Norva Pavlov M.D.   On: 04/25/2020 07:26      PMFS History: Patient Active Problem List   Diagnosis Date Noted  . Bilateral primary osteoarthritis of knee 05/07/2020  . Benign hypertension with CKD (chronic kidney disease) stage III 02/26/2020   Past Medical History:  Diagnosis Date  . Arthritis   . Hypertension     Family History  Problem Relation Age of Onset  . Cancer Mother        Brain   . Arthritis Father   . Diabetes Father   . Hypertension Sister   . Hypertension Sister     Past Surgical History:  Procedure Laterality Date  . CYSTECTOMY     Social History   Occupational History  . Occupation: Retired  Tobacco Use  . Smoking status: Never Smoker  . Smokeless tobacco: Never Used  Vaping Use  . Vaping Use: Never used  Substance and Sexual Activity  . Alcohol use: Never  . Drug use: Never  . Sexual activity: Not Currently

## 2020-06-11 ENCOUNTER — Ambulatory Visit (INDEPENDENT_AMBULATORY_CARE_PROVIDER_SITE_OTHER): Payer: Medicare HMO | Admitting: Orthopaedic Surgery

## 2020-06-11 ENCOUNTER — Encounter: Payer: Self-pay | Admitting: Orthopaedic Surgery

## 2020-06-11 ENCOUNTER — Other Ambulatory Visit: Payer: Self-pay

## 2020-06-11 VITALS — BP 126/84 | HR 101 | Ht 65.0 in | Wt 220.0 lb

## 2020-06-11 DIAGNOSIS — M170 Bilateral primary osteoarthritis of knee: Secondary | ICD-10-CM

## 2020-06-11 NOTE — Progress Notes (Signed)
Office Visit Note   Patient: Maria Hart           Date of Birth: 1951-10-24           MRN: [id number] Visit Date: 06/11/2020              Requested by: [name], DO [address] [place],  [place] [address] PCP: [name], DO   Assessment & Plan: Visit Diagnoses:  1. Bilateral primary osteoarthritis of knee     Plan: I will recheck her in 6 months.  We discussed that if she develops locking catching or increased pain we could consider diagnostic MRI to rule out medial meniscal tear.  We discussed options for treatment based on findings of the MRI.  Currently she is much improved with the injection and I can recheck her in 6 months.  Follow-Up Instructions: Return in about 6 months (around 12/12/2020).   Orders:  No orders of the defined types were placed in this encounter.  No orders of the defined types were placed in this encounter.     Procedures: No procedures performed   Clinical Data: No additional findings.   Subjective: Chief Complaint  Patient presents with  . Left Knee - Pain, Follow-up    HPI 69 year old female returns states the knee injection 05/07/2020 has given her significant improvement.  She has had some pain at the medial joint line but no pain laterally.  She is walking better she is used a knee sleeve.  She has stage III kidney disease and tries avoid anti-inflammatories but has used some Voltaren gel medially on the medial joint line of her left knee.  Previous radiographs showed a little bit more degenerative arthritis in the right than left knee.  Review of Systems all other systems unchanged from 05/07/2020 office visit.   Objective: Vital Signs: BP 126/84   Pulse (!) 101   Ht 5\' 5"  (1.651 m)   Wt 220 lb (99.8 kg)   BMI 36.61 kg/m   Physical Exam Constitutional:      Appearance: She is well-developed.  HENT:     Head: Normocephalic.     Right Ear: External ear normal.     Left Ear: External ear normal.  Eyes:      Pupils: Pupils are equal, round, and reactive to light.  Neck:     Thyroid: No thyromegaly.     Trachea: No tracheal deviation.  Cardiovascular:     Rate and Rhythm: Normal rate.  Pulmonary:     Effort: Pulmonary effort is normal.  Abdominal:     Palpations: Abdomen is soft.  Skin:    General: Skin is warm and dry.  Neurological:     Mental Status: She is alert and oriented to person, place, and time.  Psychiatric:        Behavior: Behavior normal.     Ortho Exam negative logroll the hips patient gets from sitting standing comfortably amatory without limping.  She has medial joint line tenderness no lateral joint line tenderness patella tracking is good with mild crepitus with patellofemoral loading.  Collateral ligaments are stable.  ACL PCL exam is normal.  Specialty Comments:  No specialty comments available.  Imaging: No results found.   PMFS History: Patient Active Problem List   Diagnosis Date Noted  . Bilateral primary osteoarthritis of knee 05/07/2020  . Benign hypertension with CKD (chronic kidney disease) stage III 02/26/2020   Past Medical History:  Diagnosis Date  .  Arthritis   . Hypertension     Family History  Problem Relation Age of Onset  . Cancer Mother        Brain   . Arthritis Father   . Diabetes Father   . Hypertension Sister   . Hypertension Sister     Past Surgical History:  Procedure Laterality Date  . CYSTECTOMY     Social History   Occupational History  . Occupation: Retired  Tobacco Use  . Smoking status: Never Smoker  . Smokeless tobacco: Never Used  Vaping Use  . Vaping Use: Never used  Substance and Sexual Activity  . Alcohol use: Never  . Drug use: Never  . Sexual activity: Not Currently

## 2020-08-04 ENCOUNTER — Ambulatory Visit (INDEPENDENT_AMBULATORY_CARE_PROVIDER_SITE_OTHER): Payer: Medicare HMO

## 2020-08-04 ENCOUNTER — Other Ambulatory Visit: Payer: Self-pay

## 2020-08-04 DIAGNOSIS — Z780 Asymptomatic menopausal state: Secondary | ICD-10-CM | POA: Diagnosis not present

## 2020-08-04 DIAGNOSIS — E8940 Asymptomatic postprocedural ovarian failure: Secondary | ICD-10-CM

## 2020-08-18 ENCOUNTER — Other Ambulatory Visit: Payer: Self-pay | Admitting: Family Medicine

## 2020-08-18 DIAGNOSIS — N183 Chronic kidney disease, stage 3 unspecified: Secondary | ICD-10-CM

## 2020-08-18 DIAGNOSIS — I129 Hypertensive chronic kidney disease with stage 1 through stage 4 chronic kidney disease, or unspecified chronic kidney disease: Secondary | ICD-10-CM

## 2020-09-11 ENCOUNTER — Other Ambulatory Visit: Payer: Self-pay

## 2020-09-11 ENCOUNTER — Encounter: Payer: Self-pay | Admitting: Family Medicine

## 2020-09-11 ENCOUNTER — Telehealth: Payer: Self-pay

## 2020-09-11 ENCOUNTER — Ambulatory Visit (INDEPENDENT_AMBULATORY_CARE_PROVIDER_SITE_OTHER): Payer: Medicare HMO | Admitting: Family Medicine

## 2020-09-11 VITALS — BP 139/72 | HR 75 | Temp 97.8°F | Ht 65.0 in | Wt 223.4 lb

## 2020-09-11 DIAGNOSIS — Z23 Encounter for immunization: Secondary | ICD-10-CM

## 2020-09-11 DIAGNOSIS — M170 Bilateral primary osteoarthritis of knee: Secondary | ICD-10-CM

## 2020-09-11 DIAGNOSIS — I129 Hypertensive chronic kidney disease with stage 1 through stage 4 chronic kidney disease, or unspecified chronic kidney disease: Secondary | ICD-10-CM

## 2020-09-11 DIAGNOSIS — N183 Chronic kidney disease, stage 3 unspecified: Secondary | ICD-10-CM

## 2020-09-11 LAB — BAYER DCA HB A1C WAIVED: HB A1C (BAYER DCA - WAIVED): 5.5 % (ref <7.0)

## 2020-09-11 MED ORDER — BUPROPION HCL ER (XL) 150 MG PO TB24: 150.0000 mg | ORAL_TABLET | Freq: Every morning | ORAL | 0 refills | Status: DC

## 2020-09-11 NOTE — Telephone Encounter (Signed)
Left message stating that 4 week f/u appt is scheduled for 12/9 at 4 and if this does not work to call back to reschedule

## 2020-09-11 NOTE — Telephone Encounter (Signed)
Pt has question about buPROPion (WELLBUTRIN XL) 150 MG 24 hr tablet. Pt thought she was to take this med for weight loss but seen that it is for depression too. Please call back

## 2020-09-11 NOTE — Telephone Encounter (Signed)
Attempted to contact patient - NA °

## 2020-09-11 NOTE — Progress Notes (Signed)
Subjective: CC: follow up HTN, CKD3 PCP: [name], DO Maria Hart is a 69 y.o. female presenting to clinic today for:  1. HTN w/ CKD3 Patient is compliant with lisinopril and hydrochlorothiazide.  She has no chest pain, shortness of breath, falls or edema.  In fact her edema that she was experiencing previously is totally resolved.  She denies any lower extremity cramping, dizziness.  2.  Osteoarthritis Patient with osteoarthritis of bilateral knees.  She had symptoms are controlled as needed use of meloxicam.  3.  Morbid obesity Patient continues to struggle with obesity despite trying to cut back.  She often feels deflated after 1 week of eliminating sweets and will rebound.  She has hypertension, CKD and OA as above.  She is not been on any medications for weight loss in the past but is interested in this.  No known family history of medullary thyroid cancers, MEN II tumors.  No personal history of these either.  No personal history of seizure disorder  ROS: Per HPI  No Known Allergies Past Medical History:  Diagnosis Date   Arthritis    Hypertension     Current Outpatient Medications:    hydrochlorothiazide (HYDRODIURIL) 25 MG tablet, TAKE 1/2 TO 1 (ONE-HALF TO ONE) TABLET BY MOUTH ONCE DAILY FOR BLOOD PRESSURE AND  SWELLING, Disp: 90 tablet, Rfl: 0   lisinopril (ZESTRIL) 40 MG tablet, Take 1 tablet by mouth once daily, Disp: 90 tablet, Rfl: 0   meloxicam (MOBIC) 7.5 MG tablet, Take 1 tablet (7.5 mg total) by mouth daily., Disp: 14 tablet, Rfl: 0 Social History   Socioeconomic History   Marital status: Married    Spouse name: Not on file   Number of children: 1   Years of education: Not on file   Highest education level: High school graduate  Occupational History   Occupation: Retired  Tobacco Use   Smoking status: Never Smoker   Smokeless tobacco: Never Used  Building services engineer Use: Never used  Substance and Sexual Activity    Alcohol use: Never   Drug use: Never   Sexual activity: Not Currently  Other Topics Concern   Not on file  Social History Narrative   Not on file   Social Determinants of Health   Financial Resource Strain:    Difficulty of Paying Living Expenses: Not on file  Food Insecurity:    Worried About Programme researcher, broadcasting/film/video in the Last Year: Not on file   [employer] of Food in the Last Year: Not on file  Transportation Needs:    Lack of Transportation (Medical): Not on file   Lack of Transportation (Non-Medical): Not on file  Physical Activity:    Days of Exercise per Week: Not on file   Minutes of Exercise per Session: Not on file  Stress:    Feeling of Stress : Not on file  Social Connections:    Frequency of Communication with Friends and Family: Not on file   Frequency of Social Gatherings with Friends and Family: Not on file   Attends Religious Services: Not on file   Active Member of Clubs or Organizations: Not on file   Attends Banker Meetings: Not on file   Marital Status: Not on file  Intimate Partner Violence:    Fear of Current or Ex-Partner: Not on file   Emotionally Abused: Not on file   Physically Abused: Not on file   Sexually Abused: Not on file  Family History  Problem Relation Age of Onset   Cancer Mother        Brain    Arthritis Father    Diabetes Father    Hypertension Sister    Hypertension Sister     Objective: Office vital signs reviewed. BP 139/72    Pulse 75    Temp 97.8 F (36.6 C)    Ht 5\' 5"  (1.651 m)    Wt 223 lb 6.4 oz (101.3 kg)    SpO2 100%    BMI 37.18 kg/m   Physical Examination:  General: Awake, alert, well appearing, obese female. HEENT: Normal, sclera white, MMM, no carotid bruits Cardio: regular rate and rhythm, S1S2 heard, no murmurs appreciated Pulm: clear to auscultation bilaterally, no wheezes, rhonchi or rales; normal work of breathing on room air Extremities: warm, well perfused, no  edema.  No cyanosis or clubbing; +2 pulses bilaterally MSK: normal gait and station Psych: Mood stable, speech normal, affect appropriate  Assessment/ Plan: Maria Hart is a very pleasant 69 y.o. female   1. Benign hypertension with CKD (chronic kidney disease) stage III (HCC) Check renal function panel, vitamin D and CBC - Renal Function Panel - VITAMIN D 25 Hydroxy (Vit-D Deficiency, Fractures) - CBC  2. Morbid obesity (HCC) Trial of Wellbutrin.  Offered Saxenda versus referral to 73.  Her information was provided to her today.  We discussed phentermine as well but I fear that this may impact her blood pressure. - Bayer DCA Hb A1c Waived - buPROPion (WELLBUTRIN XL) 150 MG 24 hr tablet; Take 1 tablet (150 mg total) by mouth in the morning.  Dispense: 90 tablet; Refill: 0  3. Bilateral primary osteoarthritis of knee Stable with as needed use of Moban  4. Need for immunization against influenza Administered - Flu Vaccine QUAD High Dose(Fluad)  Orders Placed This Encounter  Procedures   Flu Vaccine QUAD High Dose(Fluad)   Renal Function Panel   VITAMIN D 25 Hydroxy (Vit-D Deficiency, Fractures)   CBC   Bayer DCA Hb A1c Waived   No orders of the defined types were placed in this encounter.    [employer], DO [facility] [phone]

## 2020-09-11 NOTE — Patient Instructions (Addendum)
You had labs performed today.  You will be contacted with the results of the labs once they are available, usually in the next 3 business days for routine lab work.  If you have an active my chart account, they will be released to your MyChart.  If you prefer to have these labs released to you via telephone, please let us know.  If you had a pap smear or biopsy performed, expect to be contacted in about 7-10 days.  In preparation for our upcoming appointment regarding weight loss, I would like you to answer the following questions on a separate piece of paper and bring them with you to your appointment.   Why do you want to lose weight?  Does obesity run in your family?  What has been your highest weight/ lowest weight in the past?  What did you weigh when you were 69 years old?  What is your goal weight?  What diets/ medications have you tried and did they work?  What side effects did they have if any?  What are your "eating/ binging" triggers?  Have you ever seen a therapist regarding your eating habits?  Are you willing to?  Would you be willing to see a dietician?  Are you willing to meet with me monthly?  Do you have any personal history of thyroid disease (including thyroid cancer), pancreatitis, diabetes, high blood pressure, high cholesterol, heart attack, stroke?  Do you have any family history of thyroid disease (including thyroid cancer), pancreatitis, diabetes, high blood pressure, high cholesterol, heart attack, stroke?  If so, please list who.  Have you ever been diagnosed or had a mental health disorder (addiction, depression, anxiety, bipolar disorder, schizophrenia, etc)?  Is there family history of mental health disorders (addiction, depression, anxiety, bipolar disorder, schizophrenia, etc)?  Would you be interested in bariatric surgery if you were determined to be a candidate?  Please also bring a 3 day food journal to that appointment.  Document EVERYTHING  (including snacks/ candies/ food tastings/ drinks), what time of day you ate them and what you were doing and feeling when you ate/ drink (were you driving/ rushing to get somewhere?  Were you seated at the dinner table/ watching tv?  Were you lonely/ upset/ happy/ celebrating?)   

## 2020-09-12 LAB — RENAL FUNCTION PANEL
Albumin: 4.3 g/dL (ref 3.8–4.8)
BUN/Creatinine Ratio: 14 (ref 12–28)
BUN: 15 mg/dL (ref 8–27)
CO2: 27 mmol/L (ref 20–29)
Calcium: 9.3 mg/dL (ref 8.7–10.3)
Chloride: 103 mmol/L (ref 96–106)
Creatinine, Ser: 1.08 mg/dL — ABNORMAL HIGH (ref 0.57–1.00)
GFR calc Af Amer: 61 mL/min/{1.73_m2} (ref >59)
GFR calc non Af Amer: 53 mL/min/{1.73_m2} — ABNORMAL LOW (ref >59)
Glucose: 97 mg/dL (ref 65–99)
Phosphorus: 2.2 mg/dL — ABNORMAL LOW (ref 3.0–4.3)
Potassium: 4.2 mmol/L (ref 3.5–5.2)
Sodium: 141 mmol/L (ref 134–144)

## 2020-09-12 LAB — CBC
Hematocrit: 38.2 % (ref 34.0–46.6)
Hemoglobin: 12.8 g/dL (ref 11.1–15.9)
MCH: 29.7 pg (ref 26.6–33.0)
MCHC: 33.5 g/dL (ref 31.5–35.7)
MCV: 89 fL (ref 79–97)
Platelets: 278 10*3/uL (ref 150–450)
RBC: 4.31 x10E6/uL (ref 3.77–5.28)
RDW: 11.8 % (ref 11.7–15.4)
WBC: 7.2 10*3/uL (ref 3.4–10.8)

## 2020-09-12 LAB — VITAMIN D 25 HYDROXY (VIT D DEFICIENCY, FRACTURES): Vit D, 25-Hydroxy: 40.2 ng/mL (ref 30.0–100.0)

## 2020-09-14 NOTE — Telephone Encounter (Signed)
Spoke with patient she is aware that medication can be used for several different things and weight loss is one of them.  She reports she already has started the medication.

## 2020-10-08 ENCOUNTER — Other Ambulatory Visit: Payer: Self-pay

## 2020-10-08 ENCOUNTER — Ambulatory Visit (INDEPENDENT_AMBULATORY_CARE_PROVIDER_SITE_OTHER): Payer: Medicare HMO | Admitting: Family Medicine

## 2020-10-08 ENCOUNTER — Encounter: Payer: Self-pay | Admitting: Family Medicine

## 2020-10-08 DIAGNOSIS — N183 Chronic kidney disease, stage 3 unspecified: Secondary | ICD-10-CM | POA: Diagnosis not present

## 2020-10-08 DIAGNOSIS — I129 Hypertensive chronic kidney disease with stage 1 through stage 4 chronic kidney disease, or unspecified chronic kidney disease: Secondary | ICD-10-CM | POA: Diagnosis not present

## 2020-10-08 MED ORDER — LISINOPRIL 40 MG PO TABS: 40.0000 mg | ORAL_TABLET | Freq: Every day | ORAL | 3 refills | Status: DC

## 2020-10-08 MED ORDER — HYDROCHLOROTHIAZIDE 25 MG PO TABS: ORAL_TABLET | ORAL | 3 refills | Status: DC

## 2020-10-08 MED ORDER — BUPROPION HCL ER (XL) 150 MG PO TB24: 150.0000 mg | ORAL_TABLET | Freq: Every morning | ORAL | 3 refills | Status: DC

## 2020-10-08 NOTE — Progress Notes (Signed)
Subjective: CC: Follow-up weight PCP: Maria Ip, DO BHA:[id number] Z [name] is a 69 y.o. female presenting to clinic today for:  1.  Morbid obesity/ HTN Patient is compliant with Wellbutrin, lisinopril and hydrochlorothiazide.  She has noticed an improvement with Wellbutrin and is craving sugar less.  She is really cut her portion sizes back and is walking more.  She is down about 7 pounds.  Overall her energy is better.  Denies any chest pain, shortness of breath, edema.   ROS: Per HPI  No Known Allergies Past Medical History:  Diagnosis Date  . Arthritis   . Hypertension     Current Outpatient Medications:  .  buPROPion (WELLBUTRIN XL) 150 MG 24 hr tablet, Take 1 tablet (150 mg total) by mouth in the morning., Disp: 90 tablet, Rfl: 3 .  hydrochlorothiazide (HYDRODIURIL) 25 MG tablet, TAKE 1/2 TO 1 (ONE-HALF TO ONE) TABLET BY MOUTH ONCE DAILY FOR BLOOD PRESSURE AND  SWELLING, Disp: 90 tablet, Rfl: 3 .  lisinopril (ZESTRIL) 40 MG tablet, Take 1 tablet (40 mg total) by mouth daily., Disp: 90 tablet, Rfl: 3 Social History   Socioeconomic History  . Marital status: Married    Spouse name: Not on file  . Number of children: 1  . Years of education: Not on file  . Highest education level: High school graduate  Occupational History  . Occupation: Retired  Tobacco Use  . Smoking status: Never Smoker  . Smokeless tobacco: Never Used  Vaping Use  . Vaping Use: Never used  Substance and Sexual Activity  . Alcohol use: Never  . Drug use: Never  . Sexual activity: Not Currently  Other Topics Concern  . Not on file  Social History Narrative  . Not on file   Social Determinants of Health   Financial Resource Strain: Not on file  Food Insecurity: Not on file  Transportation Needs: Not on file  Physical Activity: Not on file  Stress: Not on file  Social Connections: Not on file  Intimate Partner Violence: Not on file   Family History  Problem Relation Age of Onset   . Cancer Mother        Brain   . Arthritis Father   . Diabetes Father   . Hypertension Sister   . Hypertension Sister     Objective: Office vital signs reviewed. BP 129/67   Pulse 79   Temp 97.7 F (36.5 C)   Ht 5\' 5"  (1.651 m)   Wt 216 lb 12.8 oz (98.3 kg)   SpO2 97%   BMI 36.08 kg/m   Physical Examination:  General: Awake, alert, well nourished, No acute distress HEENT: Normal, sclera white, MMM Cardio: regular rate and rhythm, S1S2 heard, no murmurs appreciated Pulm: clear to auscultation bilaterally, no wheezes, rhonchi or rales; normal work of breathing on room air  Assessment/ Plan: 69 y.o. female   Morbid obesity (HCC) - Plan: buPROPion (WELLBUTRIN XL) 150 MG 24 hr tablet  Benign hypertension with CKD (chronic kidney disease) stage III (HCC) - Plan: hydrochlorothiazide (HYDRODIURIL) 25 MG tablet  She is doing extremely well on the Wellbutrin and has lost about 7 pounds.  She is really achieved some great strides with lifestyle modification.  I congratulated her on this.  Continue Wellbutrin.  Follow-up with me in about 3 months, sooner if needed  Blood pressure is under control.  Continue current regimen.  Refill sent of both medicines.  No orders of the defined types were placed in this  encounter.  Meds ordered this encounter  Medications  . buPROPion (WELLBUTRIN XL) 150 MG 24 hr tablet    Sig: Take 1 tablet (150 mg total) by mouth in the morning.    Dispense:  90 tablet    Refill:  3  . hydrochlorothiazide (HYDRODIURIL) 25 MG tablet    Sig: TAKE 1/2 TO 1 (ONE-HALF TO ONE) TABLET BY MOUTH ONCE DAILY FOR BLOOD PRESSURE AND  SWELLING    Dispense:  90 tablet    Refill:  3  . lisinopril (ZESTRIL) 40 MG tablet    Sig: Take 1 tablet (40 mg total) by mouth daily.    Dispense:  90 tablet    Refill:  3     Maria Burggraf Hulen Skains, DO [facility] [phone]

## 2020-11-08 ENCOUNTER — Other Ambulatory Visit: Payer: Self-pay | Admitting: Family Medicine

## 2021-01-06 ENCOUNTER — Ambulatory Visit (INDEPENDENT_AMBULATORY_CARE_PROVIDER_SITE_OTHER): Payer: Medicare HMO | Admitting: Family Medicine

## 2021-01-06 ENCOUNTER — Encounter: Payer: Self-pay | Admitting: Family Medicine

## 2021-01-06 ENCOUNTER — Other Ambulatory Visit: Payer: Self-pay

## 2021-01-06 VITALS — BP 143/78 | HR 87 | Temp 97.5°F | Ht 65.0 in | Wt 215.0 lb

## 2021-01-06 DIAGNOSIS — N183 Chronic kidney disease, stage 3 unspecified: Secondary | ICD-10-CM

## 2021-01-06 DIAGNOSIS — I129 Hypertensive chronic kidney disease with stage 1 through stage 4 chronic kidney disease, or unspecified chronic kidney disease: Secondary | ICD-10-CM

## 2021-01-06 NOTE — Patient Instructions (Signed)
Continue small modifications.  This adds up over time Drink water before every meal Make your plate half vegetables  Get physical (even if it's during your favorite tv show; do jumping jacks/ squats during commercials)     Why follow it? Research shows. . Those who follow the Mediterranean diet have a reduced risk of heart disease  . The diet is associated with a reduced incidence of Parkinson's and Alzheimer's diseases . People following the diet may have longer life expectancies and lower rates of chronic diseases  . The Dietary Guidelines for Americans recommends the Mediterranean diet as an eating plan to promote health and prevent disease  What Is the Mediterranean Diet?  . Healthy eating plan based on typical foods and recipes of Mediterranean-style cooking . The diet is primarily a plant based diet; these foods should make up a majority of meals   Starches - Plant based foods should make up a majority of meals - They are an important sources of vitamins, minerals, energy, antioxidants, and fiber - Choose whole grains, foods high in fiber and minimally processed items  - Typical grain sources include wheat, oats, barley, corn, brown rice, bulgar, farro, millet, polenta, couscous  - Various types of beans include chickpeas, lentils, fava beans, black beans, white beans   Fruits  Veggies - Large quantities of antioxidant rich fruits & veggies; 6 or more servings  - Vegetables can be eaten raw or lightly drizzled with oil and cooked  - Vegetables common to the traditional Mediterranean Diet include: artichokes, arugula, beets, broccoli, brussel sprouts, cabbage, carrots, celery, collard greens, cucumbers, eggplant, kale, leeks, lemons, lettuce, mushrooms, okra, onions, peas, peppers, potatoes, pumpkin, radishes, rutabaga, shallots, spinach, sweet potatoes, turnips, zucchini - Fruits common to the Mediterranean Diet include: apples, apricots, avocados, cherries, clementines, dates, figs,  grapefruits, grapes, melons, nectarines, oranges, peaches, pears, pomegranates, strawberries, tangerines  Fats - Replace butter and margarine with healthy oils, such as olive oil, canola oil, and tahini  - Limit nuts to no more than a handful a day  - Nuts include walnuts, almonds, pecans, pistachios, pine nuts  - Limit or avoid candied, honey roasted or heavily salted nuts - Olives are central to the [employer] - can be eaten whole or used in a variety of dishes   Meats Protein - Limiting red meat: no more than a few times a month - When eating red meat: choose lean cuts and keep the portion to the size of deck of cards - Eggs: approx. 0 to 4 times a week  - Fish and lean poultry: at least 2 a week  - Healthy protein sources include, chicken, [place], lean beef, lamb - Increase intake of seafood such as tuna, salmon, trout, mackerel, shrimp, scallops - Avoid or limit high fat processed meats such as sausage and bacon  Dairy - Include moderate amounts of low fat dairy products  - Focus on healthy dairy such as fat free yogurt, skim milk, low or reduced fat cheese - Limit dairy products higher in fat such as whole or 2% milk, cheese, ice cream  Alcohol - Moderate amounts of red wine is ok  - No more than 5 oz daily for women (all ages) and men older than age 17  - No more than 10 oz of wine daily for men younger than 41  Other - Limit sweets and other desserts  - Use herbs and spices instead of salt to flavor foods  - Herbs and spices common to the  traditional Mediterranean Diet include: basil, bay leaves, chives, cloves, cumin, fennel, garlic, lavender, marjoram, mint, oregano, parsley, pepper, rosemary, sage, savory, sumac, tarragon, thyme   It's not just a diet, it's a lifestyle:  . The Mediterranean diet includes lifestyle factors typical of those in the region  . Foods, drinks and meals are best eaten with others and savored . Daily physical activity is important for overall good  health . This could be strenuous exercise like running and aerobics . This could also be more leisurely activities such as walking, housework, yard-work, or taking the stairs . Moderation is the key; a balanced and healthy diet accommodates most foods and drinks . Consider portion sizes and frequency of consumption of certain foods   Meal Ideas & Options:  . Breakfast:  o Whole wheat toast or whole wheat English muffins with peanut butter & hard boiled egg o Steel cut oats topped with apples & cinnamon and skim milk  o Fresh fruit: banana, strawberries, melon, berries, peaches  o Smoothies: strawberries, bananas, greek yogurt, peanut butter o Low fat greek yogurt with blueberries and granola  o Egg white omelet with spinach and mushrooms o Breakfast couscous: whole wheat couscous, apricots, skim milk, cranberries  . Sandwiches:  o Hummus and grilled vegetables (peppers, zucchini, squash) on whole wheat bread   o Grilled chicken on whole wheat pita with lettuce, tomatoes, cucumbers or tzatziki  o Tuna salad on whole wheat bread: tuna salad made with greek yogurt, olives, red peppers, capers, green onions o Garlic rosemary lamb pita: lamb sauted with garlic, rosemary, salt & pepper; add lettuce, cucumber, greek yogurt to pita - flavor with lemon juice and black pepper  . Seafood:  o Mediterranean grilled salmon, seasoned with garlic, basil, parsley, lemon juice and black pepper o Shrimp, lemon, and spinach whole-grain pasta salad made with low fat greek yogurt  o Seared scallops with lemon orzo  o Seared tuna steaks seasoned salt, pepper, coriander topped with tomato mixture of olives, tomatoes, olive oil, minced garlic, parsley, green onions and cappers  . Meats:  o Herbed greek chicken salad with kalamata olives, cucumber, feta  o Red bell peppers stuffed with spinach, bulgur, lean ground beef (or lentils) & topped with feta   o Kebabs: skewers of chicken, tomatoes, onions, zucchini,  squash  o [place] burgers: made with red onions, mint, dill, lemon juice, feta cheese topped with roasted red peppers . Vegetarian o Cucumber salad: cucumbers, artichoke hearts, celery, red onion, feta cheese, tossed in olive oil & lemon juice  o Hummus and whole grain pita points with a greek salad (lettuce, tomato, feta, olives, cucumbers, red onion) o Lentil soup with celery, carrots made with vegetable broth, garlic, salt and pepper  o Tabouli salad: parsley, bulgur, mint, scallions, cucumbers, tomato, radishes, lemon juice, olive oil, salt and pepper.

## 2021-01-06 NOTE — Progress Notes (Signed)
Subjective: CC: CKD/ HTN PCP: [name], DO Maria Hart is a 70 y.o. female presenting to clinic today for:  1. HTN w/ CKD3; obesity Patient reports compliance with her lisinopril, hydrochlorothiazide and Wellbutrin.  She does feel the Wellbutrin is helping reduce emotional eating.  She admits that she still does not restrict severely but she tries to make slight modifications and is trying to reduce her amount of sweets.  She is not very physically active currently but plans to become more physically active as the weather improves.  No chest pain, shortness of breath or edema.  No falls.  She occasionally does use NSAID for right knee pain but this is rare.  She reports good urine output and adequate hydration   ROS: Per HPI  No Known Allergies Past Medical History:  Diagnosis Date  . Arthritis   . Hypertension     Current Outpatient Medications:  .  buPROPion (WELLBUTRIN XL) 150 MG 24 hr tablet, Take 1 tablet (150 mg total) by mouth in the morning., Disp: 90 tablet, Rfl: 3 .  hydrochlorothiazide (HYDRODIURIL) 25 MG tablet, TAKE 1/2 TO 1 (ONE-HALF TO ONE) TABLET BY MOUTH ONCE DAILY FOR BLOOD PRESSURE AND  SWELLING, Disp: 90 tablet, Rfl: 3 .  lisinopril (ZESTRIL) 40 MG tablet, Take 1 tablet by mouth once daily, Disp: 90 tablet, Rfl: 0 Social History   Socioeconomic History  . Marital status: Married    Spouse name: Not on file  . Number of children: 1  . Years of education: Not on file  . Highest education level: High school graduate  Occupational History  . Occupation: Retired  Tobacco Use  . Smoking status: Never Smoker  . Smokeless tobacco: Never Used  Vaping Use  . Vaping Use: Never used  Substance and Sexual Activity  . Alcohol use: Never  . Drug use: Never  . Sexual activity: Not Currently  Other Topics Concern  . Not on file  Social History Narrative  . Not on file   Social Determinants of Health   Financial Resource Strain: Not on file   Food Insecurity: Not on file  Transportation Needs: Not on file  Physical Activity: Not on file  Stress: Not on file  Social Connections: Not on file  Intimate Partner Violence: Not on file   Family History  Problem Relation Age of Onset  . Cancer Mother        Brain   . Arthritis Father   . Diabetes Father   . Hypertension Sister   . Hypertension Sister     Objective: Office vital signs reviewed. BP (!) 143/78   Pulse 87   Temp (!) 97.5 F (36.4 C) (Temporal)   Ht 5\' 5"  (1.651 m)   Wt 215 lb (97.5 kg)   SpO2 99%   BMI 35.78 kg/m   Physical Examination:  General: Awake, alert, obese, No acute distress HEENT: Normal; sclera white; no carotid bruits Cardio: regular rate and rhythm, S1S2 heard, no murmurs appreciated Pulm: clear to auscultation bilaterally, no wheezes, rhonchi or rales; normal work of breathing on room air Extremities: warm, well perfused, No edema, cyanosis or clubbing; +2 pulses bilaterally MSK: Ambulating independently  Assessment/ Plan: 70 y.o. female   Benign hypertension with CKD (chronic kidney disease) stage III (HCC)  Morbid obesity (HCC)  Blood pressure is moderately controlled for age.  Given early CKD she may benefit from tighter control.  Hopefully this will improve as she improves her diet.  Handout provided  today on Mediterranean diet  Continue Wellbutrin.  She is down 1 pound since her visit in December.  No orders of the defined types were placed in this encounter.  No orders of the defined types were placed in this encounter.    [name], DO [facility] [phone]

## 2021-02-24 ENCOUNTER — Ambulatory Visit (INDEPENDENT_AMBULATORY_CARE_PROVIDER_SITE_OTHER): Payer: Medicare HMO

## 2021-02-24 VITALS — Ht 65.0 in | Wt 215.0 lb

## 2021-02-24 DIAGNOSIS — Z0000 Encounter for general adult medical examination without abnormal findings: Secondary | ICD-10-CM

## 2021-02-24 NOTE — Patient Instructions (Signed)
Maria Hart , Thank you for taking time to come for your Medicare Wellness Visit. I appreciate your ongoing commitment to your health goals. Please review the following plan we discussed and let me know if I can assist you in the future.   Screening recommendations/referrals: Colonoscopy: Cologuard done 03/07/2019 - Repeat every 3 years Mammogram: Done 09/22/2020  -Repeat every year Bone Density: Done 08/04/2020 - Repeat in 5 years Recommended yearly ophthalmology/optometry visit for glaucoma screening and checkup Recommended yearly dental visit for hygiene and checkup  Vaccinations: Influenza vaccine: Done 09/11/2020 - Repeat every fall Pneumococcal vaccine: DUE Tdap vaccine: DUE Shingles vaccine: DUE   Covid-19: Done 12/14/2019, 01/11/2020, & 08/24/2020  Advanced directives: Advance directive discussed with you today. I have provided a copy for you to complete at home and have notarized. Once this is complete please bring a copy in to our office so we can scan it into your chart.  Conditions/risks identified: Aim for 30 minutes of exercise or brisk walking each day, drink 6-8 glasses of water and eat lots of fruits and vegetables.  Next appointment: Follow up in one year for your annual wellness visit    Preventive Care 65 Years and Older, Female Preventive care refers to lifestyle choices and visits with your health care provider that can promote health and wellness. What does preventive care include?  A yearly physical exam. This is also called an annual well check.  Dental exams once or twice a year.  Routine eye exams. Ask your health care provider how often you should have your eyes checked.  Personal lifestyle choices, including:  Daily care of your teeth and gums.  Regular physical activity.  Eating a healthy diet.  Avoiding tobacco and drug use.  Limiting alcohol use.  Practicing safe sex.  Taking low-dose aspirin every day.  Taking vitamin and mineral  supplements as recommended by your health care provider. What happens during an annual well check? The services and screenings done by your health care provider during your annual well check will depend on your age, overall health, lifestyle risk factors, and family history of disease. Counseling  Your health care provider may ask you questions about your:  Alcohol use.  Tobacco use.  Drug use.  Emotional well-being.  Home and relationship well-being.  Sexual activity.  Eating habits.  History of falls.  Memory and ability to understand (cognition).  Work and work Astronomer.  Reproductive health. Screening  You may have the following tests or measurements:  Height, weight, and BMI.  Blood pressure.  Lipid and cholesterol levels. These may be checked every 5 years, or more frequently if you are over 67 years old.  Skin check.  Lung cancer screening. You may have this screening every year starting at age 80 if you have a 30-pack-year history of smoking and currently smoke or have quit within the past 15 years.  Fecal occult blood test (FOBT) of the stool. You may have this test every year starting at age 71.  Flexible sigmoidoscopy or colonoscopy. You may have a sigmoidoscopy every 5 years or a colonoscopy every 10 years starting at age 75.  Hepatitis C blood test.  Hepatitis B blood test.  Sexually transmitted disease (STD) testing.  Diabetes screening. This is done by checking your blood sugar (glucose) after you have not eaten for a while (fasting). You may have this done every 1-3 years.  Bone density scan. This is done to screen for osteoporosis. You may have this done starting at  age 4.  Mammogram. This may be done every 1-2 years. Talk to your health care provider about how often you should have regular mammograms. Talk with your health care provider about your test results, treatment options, and if necessary, the need for more tests. Vaccines  Your  health care provider may recommend certain vaccines, such as:  Influenza vaccine. This is recommended every year.  Tetanus, diphtheria, and acellular pertussis (Tdap, Td) vaccine. You may need a Td booster every 10 years.  Zoster vaccine. You may need this after age 16.  Pneumococcal 13-valent conjugate (PCV13) vaccine. One dose is recommended after age 8.  Pneumococcal polysaccharide (PPSV23) vaccine. One dose is recommended after age 90. Talk to your health care provider about which screenings and vaccines you need and how often you need them. This information is not intended to replace advice given to you by your health care provider. Make sure you discuss any questions you have with your health care provider. Document Released: 11/13/2015 Document Revised: 07/06/2016 Document Reviewed: 08/18/2015 Elsevier Interactive Patient Education  2017 [employer] Prevention in the Home Falls can cause injuries. They can happen to people of all ages. There are many things you can do to make your home safe and to help prevent falls. What can I do on the outside of my home?  Regularly fix the edges of walkways and driveways and fix any cracks.  Remove anything that might make you trip as you walk through a door, such as a raised step or threshold.  Trim any bushes or trees on the path to your home.  Use bright outdoor lighting.  Clear any walking paths of anything that might make someone trip, such as rocks or tools.  Regularly check to see if handrails are loose or broken. Make sure that both sides of any steps have handrails.  Any raised decks and porches should have guardrails on the edges.  Have any leaves, snow, or ice cleared regularly.  Use sand or salt on walking paths during winter.  Clean up any spills in your garage right away. This includes oil or grease spills. What can I do in the bathroom?  Use night lights.  Install grab bars by the toilet and in the tub and  shower. Do not use towel bars as grab bars.  Use non-skid mats or decals in the tub or shower.  If you need to sit down in the shower, use a plastic, non-slip stool.  Keep the floor dry. Clean up any water that spills on the floor as soon as it happens.  Remove soap buildup in the tub or shower regularly.  Attach bath mats securely with double-sided non-slip rug tape.  Do not have throw rugs and other things on the floor that can make you trip. What can I do in the bedroom?  Use night lights.  Make sure that you have a light by your bed that is easy to reach.  Do not use any sheets or blankets that are too big for your bed. They should not hang down onto the floor.  Have a firm chair that has side arms. You can use this for support while you get dressed.  Do not have throw rugs and other things on the floor that can make you trip. What can I do in the kitchen?  Clean up any spills right away.  Avoid walking on wet floors.  Keep items that you use a lot in easy-to-reach places.  If you  need to reach something above you, use a strong step stool that has a grab bar.  Keep electrical cords out of the way.  Do not use floor polish or wax that makes floors slippery. If you must use wax, use non-skid floor wax.  Do not have throw rugs and other things on the floor that can make you trip. What can I do with my stairs?  Do not leave any items on the stairs.  Make sure that there are handrails on both sides of the stairs and use them. Fix handrails that are broken or loose. Make sure that handrails are as long as the stairways.  Check any carpeting to make sure that it is firmly attached to the stairs. Fix any carpet that is loose or worn.  Avoid having throw rugs at the top or bottom of the stairs. If you do have throw rugs, attach them to the floor with carpet tape.  Make sure that you have a light switch at the top of the stairs and the bottom of the stairs. If you do not  have them, ask someone to add them for you. What else can I do to help prevent falls?  Wear shoes that:  Do not have high heels.  Have rubber bottoms.  Are comfortable and fit you well.  Are closed at the toe. Do not wear sandals.  If you use a stepladder:  Make sure that it is fully opened. Do not climb a closed stepladder.  Make sure that both sides of the stepladder are locked into place.  Ask someone to hold it for you, if possible.  Clearly mark and make sure that you can see:  Any grab bars or handrails.  First and last steps.  Where the edge of each step is.  Use tools that help you move around (mobility aids) if they are needed. These include:  Canes.  Walkers.  Scooters.  Crutches.  Turn on the lights when you go into a dark area. Replace any light bulbs as soon as they burn out.  Set up your furniture so you have a clear path. Avoid moving your furniture around.  If any of your floors are uneven, fix them.  If there are any pets around you, be aware of where they are.  Review your medicines with your doctor. Some medicines can make you feel dizzy. This can increase your chance of falling. Ask your doctor what other things that you can do to help prevent falls. This information is not intended to replace advice given to you by your health care provider. Make sure you discuss any questions you have with your health care provider. Document Released: 08/13/2009 Document Revised: 03/24/2016 Document Reviewed: 11/21/2014 Elsevier Interactive Patient Education  2017 [employer].

## 2021-02-24 NOTE — Progress Notes (Addendum)
Subjective:   BRILEE PORT is a 70 y.o. female who presents for Medicare Annual (Subsequent) preventive examination.  Virtual Visit via Telephone Note  I connected with  [name] on 02/24/21 at  1:15 PM EDT by telephone and verified that I am speaking with the correct person using two identifiers.  Location: Patient: Home Provider: [facility] Persons participating in the virtual visit: patient/Nurse Health Advisor   I discussed the limitations, risks, security and privacy concerns of performing an evaluation and management service by telephone and the availability of in person appointments. The patient expressed understanding and agreed to proceed.  Interactive audio and video telecommunications were attempted between this nurse and patient, however failed, due to patient having technical difficulties OR patient did not have access to video capability.  We continued and completed visit with audio only.  Some vital signs may be absent or patient reported.   Pansy Ostrovsky E Jashaun Penrose, LPN   Review of Systems     Cardiac Risk Factors include: advanced age (>73men, >19 women);obesity (BMI >30kg/m2);sedentary lifestyle;hypertension     Objective:    Today's Vitals   02/24/21 1335  Weight: 215 lb (97.5 kg)  Height: 5\' 5"  (1.651 m)   Body mass index is 35.78 kg/m.  Advanced Directives 02/24/2021 02/24/2020  Does Patient Have a Medical Advance Directive? No Yes  Type of Advance Directive - Living will  Would patient like information on creating a medical advance directive? Yes (MAU/Ambulatory/Procedural Areas - Information given) -    Current Medications (verified) Outpatient Encounter Medications as of 02/24/2021  Medication Sig  . buPROPion (WELLBUTRIN XL) 150 MG 24 hr tablet Take 1 tablet (150 mg total) by mouth in the morning.  . Cholecalciferol (VITAMIN D3) 25 MCG (1000 UT) CAPS Take by mouth.  . hydrochlorothiazide (HYDRODIURIL) 25 MG tablet TAKE 1/2 TO 1 (ONE-HALF TO ONE)  TABLET BY MOUTH ONCE DAILY FOR BLOOD PRESSURE AND  SWELLING  . lisinopril (ZESTRIL) 40 MG tablet Take 1 tablet by mouth once daily   No facility-administered encounter medications on file as of 02/24/2021.    Allergies (verified) Patient has no known allergies.   History: Past Medical History:  Diagnosis Date  . Arthritis   . Hypertension    Past Surgical History:  Procedure Laterality Date  . CYSTECTOMY     Family History  Problem Relation Age of Onset  . Cancer Mother        Brain   . Arthritis Father   . Diabetes Father   . Hypertension Sister   . Hypertension Sister    Social History   Socioeconomic History  . Marital status: Married    Spouse name: Not on file  . Number of children: 1  . Years of education: Not on file  . Highest education level: High school graduate  Occupational History  . Occupation: Retired  Tobacco Use  . Smoking status: Never Smoker  . Smokeless tobacco: Never Used  Vaping Use  . Vaping Use: Never used  Substance and Sexual Activity  . Alcohol use: Never  . Drug use: Never  . Sexual activity: Not Currently  Other Topics Concern  . Not on file  Social History Narrative   Lives with her husband   Social Determinants of Health   Financial Resource Strain: Low Risk   . Difficulty of Paying Living Expenses: Not hard at all  Food Insecurity: No Food Insecurity  . Worried About 02/26/2021 in the Last Year: Never true  .  Ran Out of Food in the Last Year: Never true  Transportation Needs: No Transportation Needs  . Lack of Transportation (Medical): No  . Lack of Transportation (Non-Medical): No  Physical Activity: Insufficiently Active  . Days of Exercise per Week: 3 days  . Minutes of Exercise per Session: 30 min  Stress: No Stress Concern Present  . Feeling of Stress : Only a little  Social Connections: Socially Integrated  . Frequency of Communication with Friends and Family: More than three times a week  . Frequency  of Social Gatherings with Friends and Family: Twice a week  . Attends Religious Services: More than 4 times per year  . Active Member of Clubs or Organizations: Yes  . Attends BankerClub or Organization Meetings: More than 4 times per year  . Marital Status: Married    Tobacco Counseling Counseling given: Not Answered   Clinical Intake:  Pre-visit preparation completed: Yes  Pain : No/denies pain     BMI - recorded: 35.78 Nutritional Status: BMI > 30  Obese Nutritional Risks: None Diabetes: No  How often do you need to have someone help you when you read instructions, pamphlets, or other written materials from your doctor or pharmacy?: 1 - Never  Diabetic? No  Interpreter Needed?: No  Information entered by :: Isidora Laham, LPN   Activities of Daily Living In your present state of health, do you have any difficulty performing the following activities: 02/24/2021  Hearing? N  Vision? N  Difficulty concentrating or making decisions? N  Walking or climbing stairs? N  Dressing or bathing? N  Doing errands, shopping? N  Preparing Food and eating ? N  Using the Toilet? N  In the past six months, have you accidently leaked urine? N  Do you have problems with loss of bowel control? N  Managing your Medications? N  Managing your Finances? N  Housekeeping or managing your Housekeeping? N  Some recent data might be hidden    Patient Care Team: Raliegh IpGottschalk, Ashly M, DO as PCP - General (Family Medicine)  Indicate any recent Medical Services you may have received from other than [facility] providers in the past year (date may be approximate).     Assessment:   This is a routine wellness examination for [name].  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 AWV     02/24/2020 AWV Goal: Exercise for General Health   Patient will verbalize understanding of the benefits of increased physical activity:  Exercising regularly is important. It will improve your overall fitness, flexibility, and endurance.  Regular exercise also will improve your overall health. It can help you control your weight, reduce stress, and improve your bone density.  Over the next year, patient will increase physical activity as tolerated with a goal of at least 150 minutes of moderate physical activity per week.   You  can tell that you are exercising at a moderate intensity if your heart starts beating faster and you start breathing faster but can still hold a conversation.  Moderate-intensity exercise ideas include:  Walking 1 mile (1.6 km) in about 15 minutes  Biking  Hiking  Golfing  Dancing  Water aerobics  Patient will verbalize understanding of everyday activities that increase physical activity by providing examples like the following: ? Yard work, such as: ? Pushing a Surveyor, mining ? Raking and bagging leaves ? Washing your car ? Pushing a stroller ? Shoveling snow ? Gardening ? Washing windows or floors  Patient will be able to explain general safety guidelines for exercising:   Before you start a new exercise program, talk with your health care provider.  Do not exercise so much that you hurt yourself, feel dizzy, or get very short of breath.  Wear comfortable clothes and wear shoes with good support.  Drink plenty of water while you exercise to prevent dehydration  or heat stroke.  Work out until your breathing and your heartbeat get faster.       Depression Screen PHQ 2/9 Scores 02/24/2021 01/06/2021 10/08/2020 09/11/2020 04/23/2020 02/26/2020 02/24/2020  PHQ - 2 Score 0 0 0 0 0 0 0  PHQ- 9 Score 0 0 - - - 0 -    Fall Risk Fall Risk  02/24/2021 10/08/2020 09/11/2020 04/23/2020 02/26/2020  Falls in the past year? 0 0 0 0 0  Number falls in past yr: 0 - - - -  Injury with Fall? 0 - - - -  Risk for fall due to : Orthopedic patient - - - -  Follow up Falls prevention discussed - - - -    FALL RISK PREVENTION PERTAINING TO THE HOME:  Any stairs in or around the home? No  If so, are there any without handrails? No  Home free of loose throw rugs in walkways, pet beds, electrical cords, etc? Yes  Adequate lighting in your home to reduce risk of falls? Yes   ASSISTIVE DEVICES UTILIZED TO PREVENT FALLS:  Life alert? No  Use of a cane, walker or w/c? No  Grab bars in the bathroom? No  Shower chair or bench in shower? No  Elevated toilet seat or a handicapped toilet? No   TIMED UP AND GO:  Was the test performed? No .  Telephonic visit.  Cognitive Function: Normal cognitive status assessed by direct observation by this Nurse Health Advisor. No abnormalities found.      6CIT Screen 02/24/2020  What Year? 0 points  What month? 0 points  What time? 0 points  Count back from 20 0 points  Months in reverse 0 points  Repeat phrase 2 points  Total Score 2    Immunizations Immunization History  Administered Date(s) Administered  . Fluad Quad(high Dose 65+) 08/13/2019, 09/11/2020  . Moderna Sars-Covid-2 Vaccination 12/14/2019, 01/11/2020, 08/24/2020    TDAP status: Due, Education has been provided regarding the importance of this vaccine. Advised may receive this vaccine at local pharmacy or Health Dept. Aware to provide a copy of the vaccination record if obtained from local pharmacy or Health Dept. Verbalized acceptance and understanding.  Flu  Vaccine status: Up to date  Pneumococcal vaccine status: Due, Education has been provided regarding the importance of this vaccine. Advised may receive this vaccine at local pharmacy or Health Dept. Aware to provide a copy of the vaccination record if obtained from local pharmacy or Health Dept. Verbalized acceptance and  understanding.  Covid-19 vaccine status: Completed vaccines  Qualifies for Shingles Vaccine? Yes   Zostavax completed No   Shingrix Completed?: No.    Education has been provided regarding the importance of this vaccine. Patient has been advised to call insurance company to determine out of pocket expense if they have not yet received this vaccine. Advised may also receive vaccine at local pharmacy or Health Dept. Verbalized acceptance and understanding.  Screening Tests Health Maintenance  Topic Date Due  . TETANUS/TDAP  09/11/2021 (Originally 07/18/1970)  . PNA vac Low Risk Adult (1 of 2 - PCV13) 09/11/2021 (Originally 07/17/2017)  . MAMMOGRAM  05/15/2021  . INFLUENZA VACCINE  05/31/2021  . Fecal DNA (Cologuard)  03/06/2022  . DEXA SCAN  Completed  . COVID-19 Vaccine  Completed  . Hepatitis C Screening  Completed  . HPV VACCINES  Aged Out    Health Maintenance  There are no preventive care reminders to display for this patient.  Colorectal cancer screening: Type of screening: Cologuard. Completed 03/19/2018. Repeat every 3 years  Mammogram status: Completed 05/15/2020. Repeat every year  Bone Density status: Completed 08/04/2020. Results reflect: Bone density results: NORMAL. Repeat every 5 years.  Lung Cancer Screening: (Low Dose CT Chest recommended if Age 33-80 years, 30 pack-year currently smoking OR have quit w/in 15years.) does not qualify.   Additional Screening:  Hepatitis C Screening: does qualify; Completed 08/27/2020  Vision Screening: Recommended annual ophthalmology exams for early detection of glaucoma and other disorders of the eye. Is the patient  up to date with their annual eye exam?  Yes  Who is the provider or what is the name of the office in which the patient attends annual eye exams? Dr Laural Benes If pt is not established with a provider, would they like to be referred to a provider to establish care? No .   Dental Screening: Recommended annual dental exams for proper oral hygiene  Community Resource Referral / Chronic Care Management: CRR required this visit?  No   CCM required this visit?  No      Plan:     I have personally reviewed and noted the following in the patient's chart:   . Medical and social history . Use of alcohol, tobacco or illicit drugs  . Current medications and supplements . Functional ability and status . Nutritional status . Physical activity . Advanced directives . List of other physicians . Hospitalizations, surgeries, and ER visits in previous 12 months . Vitals . Screenings to include cognitive, depression, and falls . Referrals and appointments  In addition, I have reviewed and discussed with patient certain preventive protocols, quality metrics, and best practice recommendations. A written personalized care plan for preventive services as well as general preventive health recommendations were provided to patient.     [place] Constable, LPN   05/02/5008   Nurse Notes: None

## 2021-04-12 ENCOUNTER — Encounter: Payer: Self-pay | Admitting: Family Medicine

## 2021-04-12 ENCOUNTER — Other Ambulatory Visit: Payer: Self-pay

## 2021-04-12 ENCOUNTER — Ambulatory Visit (INDEPENDENT_AMBULATORY_CARE_PROVIDER_SITE_OTHER): Payer: Medicare HMO | Admitting: Family Medicine

## 2021-04-12 VITALS — BP 140/68 | HR 73 | Temp 97.6°F | Ht 65.0 in | Wt 215.4 lb

## 2021-04-12 DIAGNOSIS — M170 Bilateral primary osteoarthritis of knee: Secondary | ICD-10-CM

## 2021-04-12 DIAGNOSIS — S66911A Strain of unspecified muscle, fascia and tendon at wrist and hand level, right hand, initial encounter: Secondary | ICD-10-CM

## 2021-04-12 DIAGNOSIS — Z0000 Encounter for general adult medical examination without abnormal findings: Secondary | ICD-10-CM

## 2021-04-12 DIAGNOSIS — Z0001 Encounter for general adult medical examination with abnormal findings: Secondary | ICD-10-CM

## 2021-04-12 DIAGNOSIS — S63501A Unspecified sprain of right wrist, initial encounter: Secondary | ICD-10-CM

## 2021-04-12 DIAGNOSIS — N183 Chronic kidney disease, stage 3 unspecified: Secondary | ICD-10-CM

## 2021-04-12 DIAGNOSIS — I129 Hypertensive chronic kidney disease with stage 1 through stage 4 chronic kidney disease, or unspecified chronic kidney disease: Secondary | ICD-10-CM

## 2021-04-12 LAB — BAYER DCA HB A1C WAIVED: HB A1C (BAYER DCA - WAIVED): 5.6 % (ref <7.0)

## 2021-04-12 MED ORDER — BUPROPION HCL ER (XL) 300 MG PO TB24: 300.0000 mg | ORAL_TABLET | Freq: Every day | ORAL | 1 refills | Status: DC

## 2021-04-12 NOTE — Patient Instructions (Signed)
Maria Hart is the once weekly and Saxenda is the daily injectable for weight loss.  See if your insurance pays for either  Wellbutrin increased to 357m daily today.  Ok to double up on the 150's until your supply is done then switch to the 3062m  You had labs performed today.  You will be contacted with the results of the labs once they are available, usually in the next 3 business days for routine lab work.  If you have an active my chart account, they will be released to your MyChart.  If you prefer to have these labs released to you via telephone, please let usKoreanow.  Preventive Care 6583ears and Older, Female Preventive care refers to lifestyle choices and visits with your health care provider that can promote health and wellness. This includes: A yearly physical exam. This is also called an annual wellness visit. Regular dental and eye exams. Immunizations. Screening for certain conditions. Healthy lifestyle choices, such as: Eating a healthy diet. Getting regular exercise. Not using drugs or products that contain nicotine and tobacco. Limiting alcohol use. What can I expect for my preventive care visit? Physical exam Your health care provider will check your: Height and weight. These may be used to calculate your BMI (body mass index). BMI is a measurement that tells if you are at a healthy weight. Heart rate and blood pressure. Body temperature. Skin for abnormal spots. Counseling Your health care provider may ask you questions about your: Past medical problems. Family's medical history. Alcohol, tobacco, and drug use. Emotional well-being. Home life and relationship well-being. Sexual activity. Diet, exercise, and sleep habits. History of falls. Memory and ability to understand (cognition). Work and work enStatisticianPregnancy and menstrual history. Access to firearms. What immunizations do I need?  Vaccines are usually given at various ages, according to a schedule.  Your health care provider will recommend vaccines for you based on your age, medicalhistory, and lifestyle or other factors, such as travel or where you work. What tests do I need? Blood tests Lipid and cholesterol levels. These may be checked every 5 years, or more often depending on your overall health. Hepatitis C test. Hepatitis B test. Screening Lung cancer screening. You may have this screening every year starting at age 5423f you have a 30-pack-year history of smoking and currently smoke or have quit within the past 15 years. Colorectal cancer screening. All adults should have this screening starting at age 6246nd continuing until age 4941Your health care provider may recommend screening at age 3358f you are at increased risk. You will have tests every 1-10 years, depending on your results and the type of screening test. Diabetes screening. This is done by checking your blood sugar (glucose) after you have not eaten for a while (fasting). You may have this done every 1-3 years. Mammogram. This may be done every 1-2 years. Talk with your health care provider about how often you should have regular mammograms. Abdominal aortic aneurysm (AAA) screening. You may need this if you are a current or former smoker. BRCA-related cancer screening. This may be done if you have a family history of breast, ovarian, tubal, or peritoneal cancers. Other tests STD (sexually transmitted disease) testing, if you are at risk. Bone density scan. This is done to screen for osteoporosis. You may have this done starting at age 6953Talk with your health care provider about your test results, treatment options,and if necessary, the need for more tests. Follow these  instructions at home: Eating and drinking  Eat a diet that includes fresh fruits and vegetables, whole grains, lean protein, and low-fat dairy products. Limit your intake of foods with high amounts of sugar, saturated fats, and salt. Take vitamin  and mineral supplements as recommended by your health care provider. Do not drink alcohol if your health care provider tells you not to drink. If you drink alcohol: Limit how much you have to 0-1 drink a day. Be aware of how much alcohol is in your drink. In the [place] one drink equals one 12 oz bottle of beer (355 mL), one 5 oz glass of wine (148 mL), or one 1 oz glass of hard liquor (44 mL).  Lifestyle Take daily care of your teeth and gums. Brush your teeth every morning and night with fluoride toothpaste. Floss one time each day. Stay active. Exercise for at least 30 minutes 5 or more days each week. Do not use any products that contain nicotine or tobacco, such as cigarettes, e-cigarettes, and chewing tobacco. If you need help quitting, ask your health care provider. Do not use drugs. If you are sexually active, practice safe sex. Use a condom or other form of protection in order to prevent STIs (sexually transmitted infections). Talk with your health care provider about taking a low-dose aspirin or statin. Find healthy ways to cope with stress, such as: Meditation, yoga, or listening to music. Journaling. Talking to a trusted person. Spending time with friends and family. Safety Always wear your seat belt while driving or riding in a vehicle. Do not drive: If you have been drinking alcohol. Do not ride with someone who has been drinking. When you are tired or distracted. While texting. Wear a helmet and other protective equipment during sports activities. If you have firearms in your house, make sure you follow all gun safety procedures. What's next? Visit your health care provider once a year for an annual wellness visit. Ask your health care provider how often you should have your eyes and teeth checked. Stay up to date on all vaccines. This information is not intended to replace advice given to you by your health care provider. Make sure you discuss any questions you have with  your healthcare provider. Document Revised: 10/07/2020 Document Reviewed: 10/11/2018 Elsevier Patient Education  2022 [employer].

## 2021-04-12 NOTE — Progress Notes (Signed)
Maria Hart is a 71 y.o. female presents to office today for annual physical exam examination.    Concerns today include: 1.  Hypertension, morbid obesity Patient was started on Wellbutrin XL 150 mg.  She continues to emotionally eat and cites that there have been multiple deaths in the family and this is how she copes.  She understands that she needs to modify her diet in efforts to improve weight.  She is compliant with her antihypertensives but typically uses only half a tablet of the hydrochlorothiazide daily.  Blood pressures typically run around 140 over 70s at home.  No chest pain, no shortness of breath, no edema  Occupation: retired, Marital status: married, Substance use: none Diet: fair, Exercise: no structured but stays active around the house Last eye exam: due Last dental exam: due Last colonoscopy: UTD Last mammogram: July Last pap smear: n/a Refills needed today: none Immunizations needed:  Immunization History  Administered Date(s) Administered   Fluad Quad(high Dose 65+) 08/13/2019, 09/11/2020   Moderna SARS-COV2 Booster Vaccination 01/11/2020   Moderna Sars-Covid-2 Vaccination 12/14/2019, 01/11/2020, 08/24/2020     Past Medical History:  Diagnosis Date   Arthritis    Hypertension    Social History   Socioeconomic History   Marital status: Married    Spouse name: Not on file   Number of children: 1   Years of education: Not on file   Highest education level: High school graduate  Occupational History   Occupation: Retired  Tobacco Use   Smoking status: Never   Smokeless tobacco: Never  Vaping Use   Vaping Use: Never used  Substance and Sexual Activity   Alcohol use: Never   Drug use: Never   Sexual activity: Not Currently  Other Topics Concern   Not on file  Social History Narrative   Lives with her husband - daughter lives nearby   Social Determinants of Health   Financial Resource Strain: Low Risk    Difficulty of Paying Living  Expenses: Not hard at all  Food Insecurity: No Food Insecurity   Worried About Programme researcher, broadcasting/film/video in the Last Year: Never true   Barista in the Last Year: Never true  Transportation Needs: No Transportation Needs   Lack of Transportation (Medical): No   Lack of Transportation (Non-Medical): No  Physical Activity: Insufficiently Active   Days of Exercise per Week: 3 days   Minutes of Exercise per Session: 30 min  Stress: No Stress Concern Present   Feeling of Stress : Only a little  Social Connections: Press photographer of Communication with Friends and Family: More than three times a week   Frequency of Social Gatherings with Friends and Family: Twice a week   Attends Religious Services: More than 4 times per year   Active Member of [employer] or Organizations: Yes   Attends Engineer, structural: More than 4 times per year   Marital Status: Married  Catering manager Violence: Not At Risk   Fear of Current or Ex-Partner: No   Emotionally Abused: No   Physically Abused: No   Sexually Abused: No   Past Surgical History:  Procedure Laterality Date   CYSTECTOMY     Family History  Problem Relation Age of Onset   Cancer Mother        Brain    Arthritis Father    Diabetes Father    Hypertension Sister    Hypertension Sister     Current  Outpatient Medications:    buPROPion (WELLBUTRIN XL) 150 MG 24 hr tablet, Take 1 tablet (150 mg total) by mouth in the morning., Disp: 90 tablet, Rfl: 3   Cholecalciferol (VITAMIN D3) 25 MCG (1000 UT) CAPS, Take by mouth., Disp: , Rfl:    hydrochlorothiazide (HYDRODIURIL) 25 MG tablet, TAKE 1/2 TO 1 (ONE-HALF TO ONE) TABLET BY MOUTH ONCE DAILY FOR BLOOD PRESSURE AND  SWELLING, Disp: 90 tablet, Rfl: 3   lisinopril (ZESTRIL) 40 MG tablet, Take 1 tablet by mouth once daily, Disp: 90 tablet, Rfl: 0  No Known Allergies   ROS: Review of Systems Pertinent items noted in HPI and remainder of comprehensive ROS otherwise  negative.    Physical exam BP 140/68   Pulse 73   Temp 97.6 F (36.4 C)   Ht 5\' 5"  (1.651 m)   Wt 215 lb 6.4 oz (97.7 kg)   SpO2 100%   BMI 35.84 kg/m  General appearance: alert, cooperative, appears stated age, no distress, and mildly obese Head: Normocephalic, without obvious abnormality, atraumatic Eyes: negative findings: lids and lashes normal, conjunctivae and sclerae normal, corneas clear, and pupils equal, round, reactive to light and accomodation Ears: normal TM's and external ear canals both ears Nose: Nares normal. Septum midline. Mucosa normal. No drainage or sinus tenderness. Throat:  Moist mucous membranes , previous dental caps noted.  No oropharyngeal masses or abnormalities appreciated Neck: no adenopathy, no carotid bruit, supple, symmetrical, trachea midline, and thyroid not enlarged, symmetric, no tenderness/mass/nodules Back: symmetric, no curvature. ROM normal. No CVA tenderness. Lungs: clear to auscultation bilaterally Heart: regular rate and rhythm, S1, S2 normal, no murmur, click, rub or gallop Abdomen: soft, non-tender; bowel sounds normal; no masses,  no organomegaly Extremities: extremities normal, atraumatic, no cyanosis or edema; no gross soft tissue swelling of the wrist.  No discoloration.  Seemingly has full active range of motion. Pulses: 2+ and symmetric Skin: Skin color, texture, turgor normal. No rashes or lesions Lymph nodes: Cervical, supraclavicular, and axillary nodes normal. Neurologic: Alert and oriented X 3, normal strength and tone. Normal symmetric reflexes. Normal coordination and gait Psych: Mood stable, speech normal, affect appropriate  Depression screen [facility] 2/9 04/12/2021 02/24/2021 01/06/2021  Decreased Interest 0 0 0  Down, Depressed, Hopeless 0 0 0  PHQ - 2 Score 0 0 0  Altered sleeping 0 0 0  Tired, decreased energy 2 0 0  Change in appetite 0 0 0  Feeling bad or failure about yourself  0 0 0  Trouble concentrating 0 0 0   Moving slowly or fidgety/restless 0 0 0  Suicidal thoughts 0 0 0  PHQ-9 Score 2 0 0    GAD 7 : Generalized Anxiety Score 04/12/2021  Nervous, Anxious, on Edge 0  Control/stop worrying 0  Worry too much - different things 1  Trouble relaxing 0  Restless 0  Easily annoyed or irritable 0  Afraid - awful might happen 0  Total GAD 7 Score 1  Anxiety Difficulty Not difficult at all   Assessment/ Plan: 04/14/2021 here for annual physical exam.   Annual physical exam  Benign hypertension with CKD (chronic kidney disease) stage III (HCC) - Plan: Renal Function Panel  Morbid obesity (HCC) - Plan: Hepatic Function Panel, Lipid Panel, Bayer DCA Hb A1c Waived, TSH, buPROPion (WELLBUTRIN XL) 300 MG 24 hr tablet  Bilateral primary osteoarthritis of knee  Sprain and strain of right wrist  Repeat manual blood pressure was within acceptable range for age.  Check renal function panel given history of CKD 3.  No changes to current medications  Weight is relatively unchanged despite use of Wellbutrin.  Continues to have episodes of emotional eating and therefore this was advanced to 300 mg.  We again discussed Wegovy versus Saxenda which I think she would be more successful with.  She will check and see if insurance will cover.  I will follow-up with her again in 3 months  Advised against ongoing use of NSAID given elevation in blood pressure and CKD 3.  Would use Tylenol for pain if needed for sprain  Counseled on healthy lifestyle choices, including diet (rich in fruits, vegetables and lean meats and low in salt and simple carbohydrates) and exercise (at least 30 minutes of moderate physical activity daily).  Patient to follow up in 1 year for annual exam or sooner if needed.  Tassie Pollett M. Nadine Counts, DO

## 2021-04-12 NOTE — Progress Notes (Signed)
Pt calling back

## 2021-04-13 LAB — RENAL FUNCTION PANEL
Albumin: 4.3 g/dL (ref 3.8–4.8)
BUN/Creatinine Ratio: 14 (ref 12–28)
BUN: 19 mg/dL (ref 8–27)
CO2: 23 mmol/L (ref 20–29)
Calcium: 9.6 mg/dL (ref 8.7–10.3)
Chloride: 105 mmol/L (ref 96–106)
Creatinine, Ser: 1.37 mg/dL — ABNORMAL HIGH (ref 0.57–1.00)
Glucose: 97 mg/dL (ref 65–99)
Phosphorus: 3 mg/dL (ref 3.0–4.3)
Potassium: 4.7 mmol/L (ref 3.5–5.2)
Sodium: 141 mmol/L (ref 134–144)
eGFR: 42 mL/min/{1.73_m2} — ABNORMAL LOW (ref >59)

## 2021-04-13 LAB — LIPID PANEL
Chol/HDL Ratio: 2.5 ratio (ref 0.0–4.4)
Cholesterol, Total: 153 mg/dL (ref 100–199)
HDL: 62 mg/dL (ref >39)
LDL Chol Calc (NIH): 77 mg/dL (ref 0–99)
Triglycerides: 70 mg/dL (ref 0–149)
VLDL Cholesterol Cal: 14 mg/dL (ref 5–40)

## 2021-04-13 LAB — TSH: TSH: 4.64 u[IU]/mL — ABNORMAL HIGH (ref 0.450–4.500)

## 2021-04-13 LAB — HEPATIC FUNCTION PANEL
ALT: 19 IU/L (ref 0–32)
AST: 15 IU/L (ref 0–40)
Alkaline Phosphatase: 72 IU/L (ref 44–121)
Bilirubin Total: 0.5 mg/dL (ref 0.0–1.2)
Bilirubin, Direct: 0.18 mg/dL (ref 0.00–0.40)
Total Protein: 6.5 g/dL (ref 6.0–8.5)

## 2021-07-13 ENCOUNTER — Ambulatory Visit: Payer: Medicare HMO | Admitting: Family Medicine

## 2021-07-28 ENCOUNTER — Other Ambulatory Visit: Payer: Self-pay

## 2021-07-28 ENCOUNTER — Encounter: Payer: Self-pay | Admitting: Family Medicine

## 2021-07-28 ENCOUNTER — Ambulatory Visit (INDEPENDENT_AMBULATORY_CARE_PROVIDER_SITE_OTHER): Payer: Medicare HMO | Admitting: Family Medicine

## 2021-07-28 DIAGNOSIS — Z23 Encounter for immunization: Secondary | ICD-10-CM

## 2021-07-28 DIAGNOSIS — I129 Hypertensive chronic kidney disease with stage 1 through stage 4 chronic kidney disease, or unspecified chronic kidney disease: Secondary | ICD-10-CM | POA: Diagnosis not present

## 2021-07-28 DIAGNOSIS — N183 Chronic kidney disease, stage 3 unspecified: Secondary | ICD-10-CM | POA: Diagnosis not present

## 2021-07-28 NOTE — Progress Notes (Signed)
Subjective: CC: Morbid obesity PCP: [name], DO AST:Maria Hart is a 70 y.o. female presenting to clinic today for:  1.  Morbid obesity Patient was seen 3 months ago.  At that time, her Wellbutrin was advanced to 300 mg.  We had discussed consideration for Saxenda versus Wegovy for treatment of obesity and she was going to inquire with her insurance as to whether or not these were covered.   She notes that she is been doing relatively well and has been decreasing portion sizes.  She does not exercise as frequently as she had been because she was told that there was a bear in their neighborhood and she was worried about walking alone.   ROS: Per HPI  No Known Allergies Past Medical History:  Diagnosis Date   Arthritis    Hypertension     Current Outpatient Medications:    buPROPion (WELLBUTRIN XL) 300 MG 24 hr tablet, Take 1 tablet (300 mg total) by mouth daily., Disp: 90 tablet, Rfl: 1   Cholecalciferol (VITAMIN D3) 25 MCG (1000 UT) CAPS, Take by mouth., Disp: , Rfl:    hydrochlorothiazide (HYDRODIURIL) 25 MG tablet, TAKE 1/2 TO 1 (ONE-HALF TO ONE) TABLET BY MOUTH ONCE DAILY FOR BLOOD PRESSURE AND  SWELLING, Disp: 90 tablet, Rfl: 3   lisinopril (ZESTRIL) 40 MG tablet, Take 1 tablet by mouth once daily, Disp: 90 tablet, Rfl: 0 Social History   Socioeconomic History   Marital status: Married    Spouse name: Not on file   Number of children: 1   Years of education: Not on file   Highest education level: High school graduate  Occupational History   Occupation: Retired  Tobacco Use   Smoking status: Never   Smokeless tobacco: Never  Vaping Use   Vaping Use: Never used  Substance and Sexual Activity   Alcohol use: Never   Drug use: Never   Sexual activity: Not Currently  Other Topics Concern   Not on file  Social History Narrative   Lives with her husband - daughter lives nearby   Social Determinants of Health   Financial Resource Strain: Low Risk     Difficulty of Paying Living Expenses: Not hard at all  Food Insecurity: No Food Insecurity   Worried About Programme researcher, broadcasting/film/video in the Last Year: Never true   Barista in the Last Year: Never true  Transportation Needs: No Transportation Needs   Lack of Transportation (Medical): No   Lack of Transportation (Non-Medical): No  Physical Activity: Insufficiently Active   Days of Exercise per Week: 3 days   Minutes of Exercise per Session: 30 min  Stress: No Stress Concern Present   Feeling of Stress : Only a little  Social Connections: Press photographer of Communication with Friends and Family: More than three times a week   Frequency of Social Gatherings with Friends and Family: Twice a week   Attends Religious Services: More than 4 times per year   Active Member of [employer] or Organizations: Yes   Attends Engineer, structural: More than 4 times per year   Marital Status: Married  Catering manager Violence: Not At Risk   Fear of Current or Ex-Partner: No   Emotionally Abused: No   Physically Abused: No   Sexually Abused: No   Family History  Problem Relation Age of Onset   Cancer Mother        Brain    Arthritis Father  Diabetes Father    Hypertension Sister    Hypertension Sister     Objective: Office vital signs reviewed. BP 134/74   Pulse 87   Temp (!) 97.3 F (36.3 C)   Ht 5\' 5"  (1.651 m)   Wt 210 lb 6.4 oz (95.4 kg)   SpO2 99%   BMI 35.01 kg/m   Physical Examination:  General: Awake, alert, well nourished, No acute distress HEENT: Normal, sclera white, MMM Cardio: regular rate and rhythm, S1S2 heard, no murmurs appreciated Pulm: clear to auscultation bilaterally, no wheezes, rhonchi or rales; normal work of breathing on room air Extremities: warm, well perfused, No edema, cyanosis or clubbing; +2 pulses bilaterally  Assessment/ Plan: 70 y.o. female   Morbid obesity (HCC)  Benign hypertension with CKD (chronic kidney disease)  stage III (HCC)  Need for immunization against influenza - Plan: Flu Vaccine QUAD High Dose(Fluad)  She is had a 5 pound weight loss since her last visit and seems to be doing relatively well on the increased dose of Wellbutrin.  No further changes needed at this time.  We will plan to reconvene again in 6 months for annual physical exam and perform fasting labs at that time.  Influenza vaccination was administered.  Will defer shingles vaccination till after the new year  No orders of the defined types were placed in this encounter.  No orders of the defined types were placed in this encounter.    78, DO [facility] [phone]

## 2021-09-28 NOTE — Progress Notes (Signed)
Appt for add img is 10/27/2021 with [facility]

## 2021-10-11 ENCOUNTER — Other Ambulatory Visit: Payer: Self-pay

## 2021-10-11 DIAGNOSIS — R928 Other abnormal and inconclusive findings on diagnostic imaging of breast: Secondary | ICD-10-CM

## 2021-10-24 ENCOUNTER — Other Ambulatory Visit: Payer: Self-pay | Admitting: Family Medicine

## 2022-01-25 ENCOUNTER — Ambulatory Visit: Payer: Medicare HMO | Admitting: Family Medicine

## 2022-01-28 ENCOUNTER — Ambulatory Visit: Payer: Medicare HMO | Admitting: Family Medicine

## 2022-01-29 ENCOUNTER — Other Ambulatory Visit: Payer: Self-pay | Admitting: Family Medicine

## 2022-02-23 ENCOUNTER — Encounter: Payer: Self-pay | Admitting: Family Medicine

## 2022-02-23 ENCOUNTER — Encounter: Payer: Self-pay | Admitting: Orthopaedic Surgery

## 2022-02-23 ENCOUNTER — Ambulatory Visit (INDEPENDENT_AMBULATORY_CARE_PROVIDER_SITE_OTHER): Payer: Medicare HMO | Admitting: Family Medicine

## 2022-02-23 ENCOUNTER — Ambulatory Visit (INDEPENDENT_AMBULATORY_CARE_PROVIDER_SITE_OTHER): Payer: Medicare HMO

## 2022-02-23 ENCOUNTER — Ambulatory Visit (INDEPENDENT_AMBULATORY_CARE_PROVIDER_SITE_OTHER): Payer: Medicare HMO | Admitting: Orthopaedic Surgery

## 2022-02-23 VITALS — Ht 65.0 in | Wt 215.0 lb

## 2022-02-23 VITALS — BP 134/75 | HR 84 | Temp 98.3°F | Ht 65.0 in | Wt 215.0 lb

## 2022-02-23 DIAGNOSIS — M25561 Pain in right knee: Secondary | ICD-10-CM

## 2022-02-23 DIAGNOSIS — G8929 Other chronic pain: Secondary | ICD-10-CM

## 2022-02-23 DIAGNOSIS — I129 Hypertensive chronic kidney disease with stage 1 through stage 4 chronic kidney disease, or unspecified chronic kidney disease: Secondary | ICD-10-CM | POA: Diagnosis not present

## 2022-02-23 DIAGNOSIS — M170 Bilateral primary osteoarthritis of knee: Secondary | ICD-10-CM

## 2022-02-23 DIAGNOSIS — Z23 Encounter for immunization: Secondary | ICD-10-CM | POA: Diagnosis not present

## 2022-02-23 DIAGNOSIS — N183 Chronic kidney disease, stage 3 unspecified: Secondary | ICD-10-CM

## 2022-02-23 MED ORDER — TRAMADOL HCL 50 MG PO TABS: 50.0000 mg | ORAL_TABLET | Freq: Two times a day (BID) | ORAL | 0 refills | Status: AC | PRN

## 2022-02-23 NOTE — Patient Instructions (Signed)
No ibuprofen, no aleve ORALLY because of the kidney function.  These meds can make it worse, which is probably why they haven't sent anything.  You CAN use topical aleve or voltaren gel. ?

## 2022-02-23 NOTE — Progress Notes (Signed)
My knee is hurting. ? ?She has had pain of the right knee for several months getting gradually worse. She has swelling and popping but no giving way.  She was seen at Urgent Care recently but they did not do X-rays or give any medicine. ? ?She has tried Tylenol, ice, rest with little help. ? ?She has no redness, no weakness. ? ?Right knee has ROM of 1 to 105, crepitus, lateral joint line pain, slight effusion, stable, slight limp right, NV intact. ? ?X-rays were done of the right knee, reported separately. ? ?Encounter Diagnosis  ?Name Primary?  ? Chronic pain of right knee Yes  ? ?PROCEDURE NOTE: ? ?The patient requests injections of the right knee , verbal consent was obtained. ? ?The right knee was prepped appropriately after time out was performed.  ? ?Sterile technique was observed and injection of 1 cc of Celestone 6mg  with several cc's of plain xylocaine. Anesthesia was provided by ethyl chloride and a 20-gauge needle was used to inject the knee area. The injection was tolerated well.  A band aid dressing was applied. ? ?The patient was advised to apply ice later today and tomorrow to the injection sight as needed. ? ?I will begin naprosyn 500 po bid pc. ? ?Return in two weeks. ? ?Call if any problem. ? ?Precautions discussed. ? ?Electronically Signed ? , MD ?4/26/202311:35 AM ? ?

## 2022-02-23 NOTE — Progress Notes (Signed)
? ?Subjective: ?CC: Morbidly obese, hypertension with CKD ?PCP: Janora Norlander, DO ?[id number] Z Pennywell is a 71 y.o. female presenting to clinic today for: ? ?1.  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  She is compliant with lisinopril 40 mg daily, hydrochlorothiazide 25 mg daily.  She reports good urine output.  No dysuria or hematuria reported.  She has not been utilizing any oral NSAIDs but was told by her orthopedist today that they would be sending in high-dose Aleve.  She has not gotten any messages from her pharmacy to indicate that this had been done yet.  She has used topical medications for pain relief in the past but notes that her pain had gotten so severe that she in fact needed that corticosteroid injection done today.  So far things seem to be a little bit better since getting the injection done.  She admits that she really has not been exercising like she had been due to the pain in the knee and limited mobility.  Wants to know what she can do to improve her renal function.  She brings a card to me today for renal monitoring. ? ? ?ROS: Per HPI ? ?No Known Allergies ?Past Medical History:  ?Diagnosis Date  ? Arthritis   ? Hypertension   ? ? ?Current Outpatient Medications:  ?  buPROPion (WELLBUTRIN XL) 300 MG 24 hr tablet, Take 1 tablet (300 mg total) by mouth daily., Disp: 90 tablet, Rfl: 1 ?  Cholecalciferol (VITAMIN D3) 25 MCG (1000 UT) CAPS, Take by mouth., Disp: , Rfl:  ?  hydrochlorothiazide (HYDRODIURIL) 25 MG tablet, TAKE 1/2 TO 1 (ONE-HALF TO ONE) TABLET BY MOUTH ONCE DAILY FOR BLOOD PRESSURE AND  SWELLING, Disp: 90 tablet, Rfl: 3 ?  lisinopril (ZESTRIL) 40 MG tablet, Take 1 tablet by mouth once daily, Disp: 90 tablet, Rfl: 0 ?Social History  ? ?Socioeconomic History  ? Marital status: Married  ?  Spouse name: Not on file  ? Number of children: 1  ? Years of education: Not on file  ? Highest education level: High school graduate   ?Occupational History  ? Occupation: Retired  ?Tobacco Use  ? Smoking status: Never  ? Smokeless tobacco: Never  ?Vaping Use  ? Vaping Use: Never used  ?Substance and Sexual Activity  ? Alcohol use: Never  ? Drug use: Never  ? Sexual activity: Not Currently  ?Other Topics Concern  ? Not on file  ?Social History Narrative  ? Lives with her husband - daughter lives nearby  ? ?Social Determinants of Health  ? ?Financial Resource Strain: Low Risk   ? Difficulty of Paying Living Expenses: Not hard at all  ?Food Insecurity: No Food Insecurity  ? Worried About Charity fundraiser in the Last Year: Never true  ? Ran Out of Food in the Last Year: Never true  ?Transportation Needs: No Transportation Needs  ? Lack of Transportation (Medical): No  ? Lack of Transportation (Non-Medical): No  ?Physical Activity: Insufficiently Active  ? Days of Exercise per Week: 3 days  ? Minutes of Exercise per Session: 30 min  ?Stress: No Stress Concern Present  ? Feeling of Stress : Only a little  ?Social Connections: Socially Integrated  ? Frequency of Communication with Friends and Family: More than three times a week  ? Frequency of Social Gatherings with Friends and Family: Twice a week  ? Attends Religious Services: More than 4 times per year  ?  Active Member of Clubs or Organizations: Yes  ? Attends Archivist Meetings: More than 4 times per year  ? Marital Status: Married  ?Intimate Partner Violence: Not At Risk  ? Fear of Current or Ex-Partner: No  ? Emotionally Abused: No  ? Physically Abused: No  ? Sexually Abused: No  ? ?Family History  ?Problem Relation Age of Onset  ? Cancer Mother   ?     Brain   ? Arthritis Father   ? Diabetes Father   ? Hypertension Sister   ? Hypertension Sister   ? ? ?Objective: ?Office vital signs reviewed. ?BP 134/75   Pulse 84   Temp 98.3 ?F (36.8 ?C)   Ht 5\' 5"  (1.651 m)   Wt 215 lb (97.5 kg)   SpO2 99%   BMI 35.78 kg/m?  ? ?Physical Examination:  ?General: Awake, alert, morbid obesity,  No acute distress ?HEENT: Sclera white. ?Cardio: regular rate and rhythm, S1S2 heard, no murmurs appreciated ?Pulm: Normal work of breathing on room air ?MSK: Slightly antalgic gait but ambulating independently.  Right knee with slight joint effusion. ? ?Assessment/ Plan: ?70 y.o. female  ? ?Benign hypertension with CKD (chronic kidney disease) stage III (HCC) ? ?Bilateral primary osteoarthritis of knee - Plan: traMADol (ULTRAM) 50 MG tablet ? ?Morbid obesity ([place]) ? ?Need for pneumococcal 20-valent conjugate vaccination - Plan: Pneumococcal conjugate vaccine 20-valent (Prevnar 20) ? ?We will plan for renal function test in the next couple of months along with annual physical and fasting labs.  I reinforced need to avoid use of oral NSAIDs in the setting of CKD.  She was technically consistent with CKD 3B last June.  I have given her a small supply of tramadol to have on hand for severe knee pain.  Okay to use oral Tylenol 3 times daily as needed.  I have given her a sample of Voltaren gel to use topically if needed.  Weight loss of course is recommended and hopefully now that she had a corticosteroid injection to the right knee she will have a little bit more mobility. ? ?Pneumococcal vaccination and for shingles vaccination administered today ? ?The Narcotic Database has been reviewed.  There were no red flags.   ? ? ?No orders of the defined types were placed in this encounter. ? ?No orders of the defined types were placed in this encounter. ? ? ? ?Janora Norlander, DO ?[place] ?([phone] ? ? ?

## 2022-02-25 ENCOUNTER — Ambulatory Visit (INDEPENDENT_AMBULATORY_CARE_PROVIDER_SITE_OTHER): Payer: Medicare HMO

## 2022-02-25 VITALS — Wt 215.0 lb

## 2022-02-25 DIAGNOSIS — I10 Essential (primary) hypertension: Secondary | ICD-10-CM | POA: Diagnosis not present

## 2022-02-25 DIAGNOSIS — Z1211 Encounter for screening for malignant neoplasm of colon: Secondary | ICD-10-CM

## 2022-02-25 DIAGNOSIS — Z0001 Encounter for general adult medical examination with abnormal findings: Secondary | ICD-10-CM

## 2022-02-25 DIAGNOSIS — Z0000 Encounter for general adult medical examination without abnormal findings: Secondary | ICD-10-CM

## 2022-02-25 NOTE — Progress Notes (Signed)
? ?Subjective:  ? Maria Hart is a 71 y.o. female who presents for Medicare Annual (Subsequent) preventive examination. ? ?Virtual Visit via Telephone Note ? ?I connected with  [name] on 02/25/22 at 11:15 AM EDT by telephone and verified that I am speaking with the correct person using two identifiers. ? ?Location: ?Patient: Home ?Provider: [facility] ?Persons participating in the virtual visit: patient/Nurse Health Advisor ?  ?I discussed the limitations, risks, security and privacy concerns of performing an evaluation and management service by telephone and the availability of in person appointments. The patient expressed understanding and agreed to proceed. ? ?Interactive audio and video telecommunications were attempted between this nurse and patient, however failed, due to patient having technical difficulties OR patient did not have access to video capability.  We continued and completed visit with audio only. ? ?Some vital signs may be absent or patient reported.  ? ?[name], LPN  ? ?Review of Systems    ? ?Cardiac Risk Factors include: advanced age (>44men, >49 women);obesity (BMI >30kg/m2);sedentary lifestyle;hypertension ? ?   ?Objective:  ?  ?Today's Vitals  ? 02/25/22 1118  ?Weight: 215 lb (97.5 kg)  ? ?Body mass index is 35.78 kg/m?. ? ? ?  02/25/2022  ? 11:33 AM 02/24/2021  ?  1:46 PM 02/24/2020  ?  1:41 PM  ?Advanced Directives  ?Does Patient Have a Medical Advance Directive? Yes No Yes  ?Type of Estate agent of [place];Living will  Living will  ?Copy of Healthcare Power of Attorney in Chart? No - copy requested    ?Would patient like information on creating a medical advance directive?  Yes (MAU/Ambulatory/Procedural Areas - Information given)   ? ? ?Current Medications (verified) ?Outpatient Encounter Medications as of 02/25/2022  ?Medication Sig  ? Cholecalciferol (VITAMIN D3) 25 MCG (1000 UT) CAPS Take by mouth.  ? hydrochlorothiazide (HYDRODIURIL) 25 MG tablet  TAKE 1/2 TO 1 (ONE-HALF TO ONE) TABLET BY MOUTH ONCE DAILY FOR BLOOD PRESSURE AND  SWELLING  ? lisinopril (ZESTRIL) 40 MG tablet Take 1 tablet by mouth once daily  ? traMADol (ULTRAM) 50 MG tablet Take 1 tablet (50 mg total) by mouth every 12 (twelve) hours as needed for up to 5 days for severe pain.  ? [DISCONTINUED] buPROPion (WELLBUTRIN XL) 300 MG 24 hr tablet Take 1 tablet (300 mg total) by mouth daily. (Patient not taking: Reported on 02/25/2022)  ? ?No facility-administered encounter medications on file as of 02/25/2022.  ? ? ?Allergies (verified) ?Patient has no known allergies.  ? ?History: ?Past Medical History:  ?Diagnosis Date  ? Arthritis   ? Hypertension   ? ?Past Surgical History:  ?Procedure Laterality Date  ? CYSTECTOMY    ? ?Family History  ?Problem Relation Age of Onset  ? Cancer Mother   ?     Brain   ? Arthritis Father   ? Diabetes Father   ? Hypertension Sister   ? Hypertension Sister   ? ?Social History  ? ?Socioeconomic History  ? Marital status: Married  ?  Spouse name: Kelby Fam  ? Number of children: 1  ? Years of education: Not on file  ? Highest education level: High school graduate  ?Occupational History  ? Occupation: Retired  ?Tobacco Use  ? Smoking status: Never  ? Smokeless tobacco: Never  ?Vaping Use  ? Vaping Use: Never used  ?Substance and Sexual Activity  ? Alcohol use: Never  ? Drug use: Never  ? Sexual activity: Not Currently  ?  Other Topics Concern  ? Not on file  ?Social History Narrative  ? Lives with her husband - daughter lives nearby  ? ?Social Determinants of Health  ? ?Financial Resource Strain: Low Risk   ? Difficulty of Paying Living Expenses: Not hard at all  ?Food Insecurity: No Food Insecurity  ? Worried About Programme researcher, broadcasting/film/videounning Out of Food in the Last Year: Never true  ? Ran Out of Food in the Last Year: Never true  ?Transportation Needs: No Transportation Needs  ? Lack of Transportation (Medical): No  ? Lack of Transportation (Non-Medical): No  ?Physical Activity: Sufficiently  Active  ? Days of Exercise per Week: 5 days  ? Minutes of Exercise per Session: 30 min  ?Stress: No Stress Concern Present  ? Feeling of Stress : Only a little  ?Social Connections: Socially Integrated  ? Frequency of Communication with Friends and Family: More than three times a week  ? Frequency of Social Gatherings with Friends and Family: More than three times a week  ? Attends Religious Services: More than 4 times per year  ? Active Member of Clubs or Organizations: Yes  ? Attends BankerClub or Organization Meetings: More than 4 times per year  ? Marital Status: Married  ? ? ?Tobacco Counseling ?Counseling given: Not Answered ? ? ?Clinical Intake: ? ?Pre-visit preparation completed: Yes ? ?Pain : No/denies pain ? ?  ? ?BMI - recorded: 35.78 ?Nutritional Status: BMI > 30  Obese ?Nutritional Risks: None ?Diabetes: No ? ?How often do you need to have someone help you when you read instructions, pamphlets, or other written materials from your doctor or pharmacy?: 1 - Never ? ?Diabetic? no ? ?Interpreter Needed?: No ? ?Information entered by :: Frayda Egley, LPN ? ? ?Activities of Daily Living ? ?  02/25/2022  ? 11:32 AM  ?In your present state of health, do you have any difficulty performing the following activities:  ?Hearing? 0  ?Vision? 0  ?Difficulty concentrating or making decisions? 0  ?Walking or climbing stairs? 0  ?Dressing or bathing? 0  ?Doing errands, shopping? 0  ?Preparing Food and eating ? N  ?Using the Toilet? N  ?In the past six months, have you accidently leaked urine? N  ?Do you have problems with loss of bowel control? N  ?Managing your Medications? N  ?Managing your Finances? N  ?Housekeeping or managing your Housekeeping? N  ? ? ?Patient Care Team: ?Raliegh IpGottschalk, Ashly M, DO as PCP - General (Family Medicine) ?Delora FuelJohnson, Brent, OD (Optometry) ? ?Indicate any recent Medical Services you may have received from other than [facility] providers in the past year (date may be approximate). ? ?   ?Assessment:  ? This  is a routine wellness examination for Maria Hart. ? ?Hearing/Vision screen ?Hearing Screening - Comments:: Denies hearing difficulties   ?Vision Screening - Comments:: Wears rx glasses - up to date with routine eye exams with [facility] ? ?Dietary issues and exercise activities discussed: ?Current Exercise Habits: Home exercise routine, Type of exercise: strength training/weights;stretching;treadmill;walking, Time (Minutes): 30, Frequency (Times/Week): 3, Weekly Exercise (Minutes/Week): 90, Intensity: Mild, Exercise limited by: orthopedic condition(s) ? ? Goals Addressed   ? ?  ?  ?  ?  ? This Visit's Progress  ?  AWV   On track  ?  02/25/2022 ?AWV Goal: Exercise for General Health ? ?Patient will verbalize understanding of the benefits of increased physical activity: ?Exercising regularly is important. It will improve your overall fitness, flexibility, and endurance. ?Regular exercise also will improve  your overall health. It can help you control your weight, reduce stress, and improve your bone density. ?Over the next year, patient will increase physical activity as tolerated with a goal of at least 150 minutes of moderate physical activity per week.  ?You can tell that you are exercising at a moderate intensity if your heart starts beating faster and you start breathing faster but can still hold a conversation. ?Moderate-intensity exercise ideas include: ?Walking 1 mile (1.6 km) in about 15 minutes ?Biking ?Hiking ?Golfing ?Dancing ?Water aerobics ?Patient will verbalize understanding of everyday activities that increase physical activity by providing examples like the following: ?Yard work, such as: ?Pushing a Surveyor, mining ?Raking and bagging leaves ?Washing your car ?Pushing a stroller ?Shoveling snow ?Gardening ?Washing windows or floors ?Patient will be able to explain general safety guidelines for exercising:  ?Before you start a new exercise program, talk with your health care provider. ?Do not exercise so  much that you hurt yourself, feel dizzy, or get very short of breath. ?Wear comfortable clothes and wear shoes with good support. ?Drink plenty of water while you exercise to prevent dehydration or heat st

## 2022-02-25 NOTE — Patient Instructions (Addendum)
Maria Hart , ?Thank you for taking time to come for your Medicare Wellness Visit. I appreciate your ongoing commitment to your health goals. Please review the following plan we discussed and let me know if I can assist you in the future.  ? ?Screening recommendations/referrals: ?Colonoscopy: Cologuard done 03/07/2019 - repeat every 3 years - ordered today* ?Mammogram: Done 09/20/2021 - Repeat annually  ?Bone Density: Done 08/04/2020 - Repeat in 5 years  ?Recommended yearly ophthalmology/optometry visit for glaucoma screening and checkup ?Recommended yearly dental visit for hygiene and checkup ? ?Vaccinations: ?Influenza vaccine: Done 07/28/2021 - Repeat annually ?Pneumococcal vaccine: Done 02/23/2022  ?Tdap vaccine: Due - recommended every 10 years ?Shingles vaccine: Done 02/23/2022 - get second dose in 2-6 months   ?Covid-19: Done 12/14/2019, 01/11/2020, & 08/24/2020 ? ?Advanced directives: Please bring a copy of your health care power of attorney and living will to the office to be added to your chart at your convenience.  ? ?Conditions/risks identified: Aim for 30 minutes of exercise or brisk walking, 6-8 glasses of water, and 5 servings of fruits and vegetables each day. At the end of this summary, you will find seated exercises - hopefully you can do these when your knee is bothering you. ? ?Next appointment: Follow up in one year for your annual wellness visit  ? ? ?Preventive Care 27 Years and Older, Female ?Preventive care refers to lifestyle choices and visits with your health care provider that can promote health and wellness. ?What does preventive care include? ?A yearly physical exam. This is also called an annual well check. ?Dental exams once or twice a year. ?Routine eye exams. Ask your health care provider how often you should have your eyes checked. ?Personal lifestyle choices, including: ?Daily care of your teeth and gums. ?Regular physical activity. ?Eating a healthy diet. ?Avoiding tobacco and drug  use. ?Limiting alcohol use. ?Practicing safe sex. ?Taking low-dose aspirin every day. ?Taking vitamin and mineral supplements as recommended by your health care provider. ?What happens during an annual well check? ?The services and screenings done by your health care provider during your annual well check will depend on your age, overall health, lifestyle risk factors, and family history of disease. ?Counseling  ?Your health care provider may ask you questions about your: ?Alcohol use. ?Tobacco use. ?Drug use. ?Emotional well-being. ?Home and relationship well-being. ?Sexual activity. ?Eating habits. ?History of falls. ?Memory and ability to understand (cognition). ?Work and work Astronomer. ?Reproductive health. ?Screening  ?You may have the following tests or measurements: ?Height, weight, and BMI. ?Blood pressure. ?Lipid and cholesterol levels. These may be checked every 5 years, or more frequently if you are over 72 years old. ?Skin check. ?Lung cancer screening. You may have this screening every year starting at age 50 if you have a 30-pack-year history of smoking and currently smoke or have quit within the past 15 years. ?Fecal occult blood test (FOBT) of the stool. You may have this test every year starting at age 72. ?Flexible sigmoidoscopy or colonoscopy. You may have a sigmoidoscopy every 5 years or a colonoscopy every 10 years starting at age 86. ?Hepatitis C blood test. ?Hepatitis B blood test. ?Sexually transmitted disease (STD) testing. ?Diabetes screening. This is done by checking your blood sugar (glucose) after you have not eaten for a while (fasting). You may have this done every 1-3 years. ?Bone density scan. This is done to screen for osteoporosis. You may have this done starting at age 34. ?Mammogram. This may be done every  1-2 years. Talk to your health care provider about how often you should have regular mammograms. ?Talk with your health care provider about your test results, treatment  options, and if necessary, the need for more tests. ?Vaccines  ?Your health care provider may recommend certain vaccines, such as: ?Influenza vaccine. This is recommended every year. ?Tetanus, diphtheria, and acellular pertussis (Tdap, Td) vaccine. You may need a Td booster every 10 years. ?Zoster vaccine. You may need this after age 19. ?Pneumococcal 13-valent conjugate (PCV13) vaccine. One dose is recommended after age 70. ?Pneumococcal polysaccharide (PPSV23) vaccine. One dose is recommended after age 10. ?Talk to your health care provider about which screenings and vaccines you need and how often you need them. ?This information is not intended to replace advice given to you by your health care provider. Make sure you discuss any questions you have with your health care provider. ?Document Released: 11/13/2015 Document Revised: 07/06/2016 Document Reviewed: 08/18/2015 ?Elsevier Interactive Patient Education ? 2017 [employer] ? ?Fall Prevention in the Home ?Falls can cause injuries. They can happen to people of all ages. There are many things you can do to make your home safe and to help prevent falls. ?What can I do on the outside of my home? ?Regularly fix the edges of walkways and driveways and fix any cracks. ?Remove anything that might make you trip as you walk through a door, such as a raised step or threshold. ?Trim any bushes or trees on the path to your home. ?Use bright outdoor lighting. ?Clear any walking paths of anything that might make someone trip, such as rocks or tools. ?Regularly check to see if handrails are loose or broken. Make sure that both sides of any steps have handrails. ?Any raised decks and porches should have guardrails on the edges. ?Have any leaves, snow, or ice cleared regularly. ?Use sand or salt on walking paths during winter. ?Clean up any spills in your garage right away. This includes oil or grease spills. ?What can I do in the bathroom? ?Use night lights. ?Install grab  bars by the toilet and in the tub and shower. Do not use towel bars as grab bars. ?Use non-skid mats or decals in the tub or shower. ?If you need to sit down in the shower, use a plastic, non-slip stool. ?Keep the floor dry. Clean up any water that spills on the floor as soon as it happens. ?Remove soap buildup in the tub or shower regularly. ?Attach bath mats securely with double-sided non-slip rug tape. ?Do not have throw rugs and other things on the floor that can make you trip. ?What can I do in the bedroom? ?Use night lights. ?Make sure that you have a light by your bed that is easy to reach. ?Do not use any sheets or blankets that are too big for your bed. They should not hang down onto the floor. ?Have a firm chair that has side arms. You can use this for support while you get dressed. ?Do not have throw rugs and other things on the floor that can make you trip. ?What can I do in the kitchen? ?Clean up any spills right away. ?Avoid walking on wet floors. ?Keep items that you use a lot in easy-to-reach places. ?If you need to reach something above you, use a strong step stool that has a grab bar. ?Keep electrical cords out of the way. ?Do not use floor polish or wax that makes floors slippery. If you must use wax, use non-skid  floor wax. ?Do not have throw rugs and other things on the floor that can make you trip. ?What can I do with my stairs? ?Do not leave any items on the stairs. ?Make sure that there are handrails on both sides of the stairs and use them. Fix handrails that are broken or loose. Make sure that handrails are as long as the stairways. ?Check any carpeting to make sure that it is firmly attached to the stairs. Fix any carpet that is loose or worn. ?Avoid having throw rugs at the top or bottom of the stairs. If you do have throw rugs, attach them to the floor with carpet tape. ?Make sure that you have a light switch at the top of the stairs and the bottom of the stairs. If you do not have them,  ask someone to add them for you. ?What else can I do to help prevent falls? ?Wear shoes that: ?Do not have high heels. ?Have rubber bottoms. ?Are comfortable and fit you well. ?Are closed at the toe. Do not wea

## 2022-03-09 ENCOUNTER — Ambulatory Visit: Payer: Medicare HMO | Admitting: Orthopaedic Surgery

## 2022-03-09 ENCOUNTER — Encounter: Payer: Self-pay | Admitting: Orthopaedic Surgery

## 2022-03-09 DIAGNOSIS — M25561 Pain in right knee: Secondary | ICD-10-CM | POA: Diagnosis not present

## 2022-03-09 DIAGNOSIS — G8929 Other chronic pain: Secondary | ICD-10-CM | POA: Diagnosis not present

## 2022-03-09 DIAGNOSIS — M170 Bilateral primary osteoarthritis of knee: Secondary | ICD-10-CM

## 2022-03-09 NOTE — Progress Notes (Signed)
PROCEDURE NOTE: ? ?The patient requests injections of the right knee , verbal consent was obtained. ? ?The right knee was prepped appropriately after time out was performed.  ? ?Sterile technique was observed and injection of 1 cc of DepoMedrol 40mg  with several cc's of plain xylocaine. Anesthesia was provided by ethyl chloride and a 20-gauge needle was used to inject the knee area. The injection was tolerated well.  A band aid dressing was applied. ? ?The patient was advised to apply ice later today and tomorrow to the injection sight as needed. ? ?I tried aspiration but very little fluid. ? ?Encounter Diagnoses  ?Name Primary?  ? Chronic pain of right knee Yes  ? Bilateral primary osteoarthritis of knee   ? ?I will get MRI of the right knee as it is worse, giving way, popping, swelling and not improving with conservative treatment. ? ?Return in one month. ? ?Call if any problem. ? ?Precautions discussed. ? ?Electronically Signed ? , MD ?5/10/202310:00 AM ? ?

## 2022-03-09 NOTE — Addendum Note (Signed)
Addended by: Jodene Nam A on: 03/09/2022 10:59 AM ? ? Modules accepted: Orders ? ?

## 2022-03-23 LAB — COLOGUARD: COLOGUARD: NEGATIVE

## 2022-03-25 ENCOUNTER — Ambulatory Visit (HOSPITAL_COMMUNITY)
Admission: RE | Admit: 2022-03-25 | Discharge: 2022-03-25 | Disposition: A | Discharge status: Home / Self Care | Payer: Medicare HMO | Source: Ambulatory Visit | Attending: Orthopaedic Surgery | Admitting: Orthopaedic Surgery

## 2022-03-25 DIAGNOSIS — G8929 Other chronic pain: Secondary | ICD-10-CM | POA: Diagnosis present

## 2022-03-25 DIAGNOSIS — M170 Bilateral primary osteoarthritis of knee: Secondary | ICD-10-CM | POA: Diagnosis present

## 2022-03-25 DIAGNOSIS — M25561 Pain in right knee: Secondary | ICD-10-CM | POA: Insufficient documentation

## 2022-03-25 IMAGING — MR MR KNEE*R* W/O CM
6 series · 40 of 40 positions shown · non-contrast
Comparison: None Available.

CLINICAL DATA: Chronic knee pain.

EXAM:
MRI OF THE RIGHT KNEE WITHOUT CONTRAST
TECHNIQUE: Multiplanar, multisequence MR imaging of the right knee was
performed. No intravenous contrast was administered.

[Series 8: T2 fat-sat · axial · right · 4.0mm · 0.47mm/px · z∈[-32,+93]mm · 5 of 26 slices shown (1 of 3)]
[im 1/26]
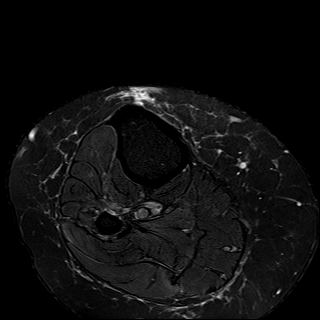
[im 7/26]
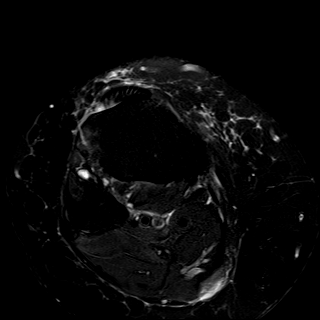
[im 13/26]
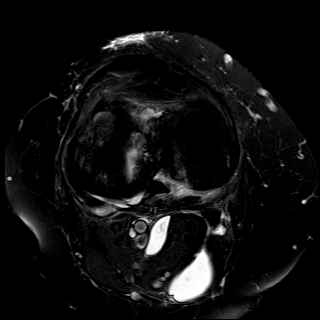
[im 19/26]
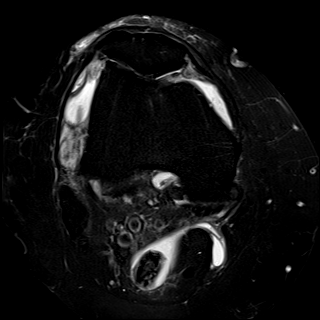
[im 26/26]
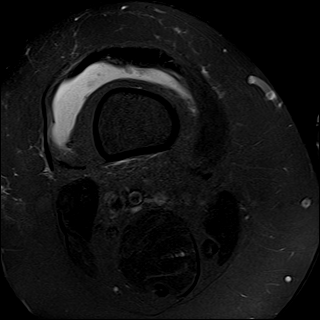

[Series 9: T1 · coronal · right · 4.0mm · 0.59mm/px · 6 of 24 slices shown]
[im 1/24]
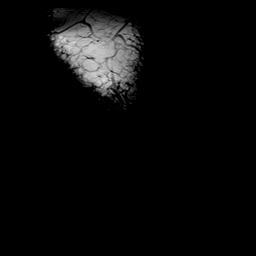
[im 5/24]
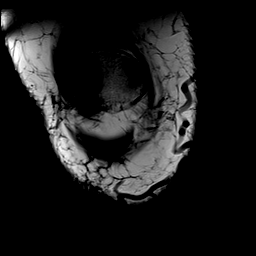
[im 10/24]
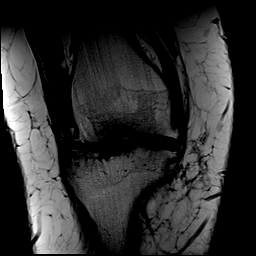
[im 14/24]
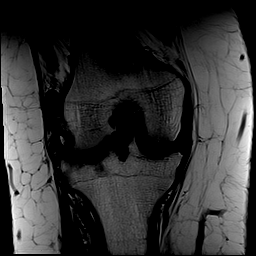
[im 19/24]
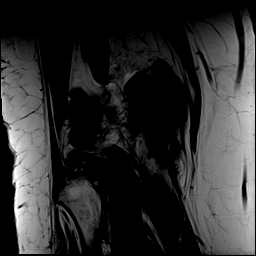
[im 24/24]
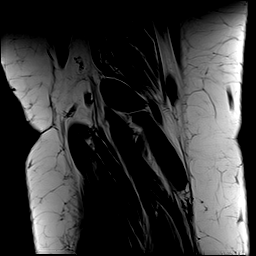

[Series 10: T2 fat-sat · coronal · right · 4.0mm · 0.59mm/px · 7 of 27 slices shown (2 of 3)]
[im 1/27]
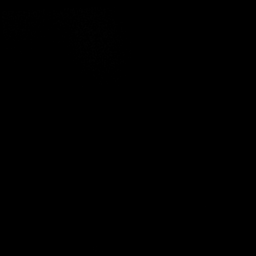
[im 5/27]
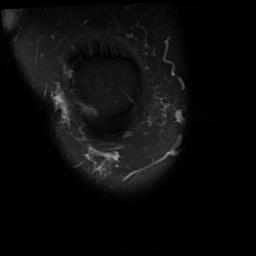
[im 9/27]
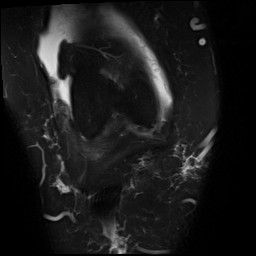
[im 14/27]
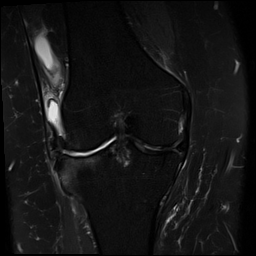
[im 18/27]
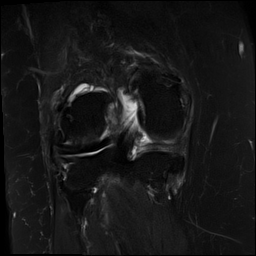
[im 22/27]
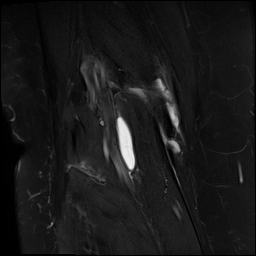
[im 27/27]
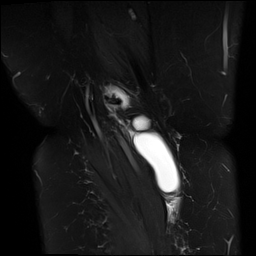

[Series 11: PD fat-sat · coronal · right · 4.0mm · 0.59mm/px · 7 of 27 slices shown (1 of 2)]
[im 1/27]
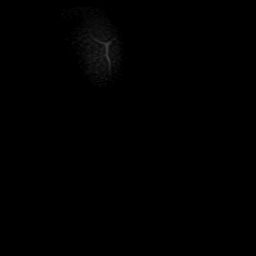
[im 5/27]
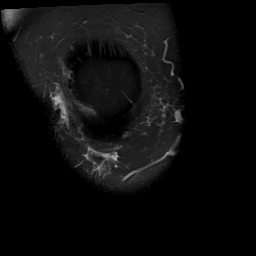
[im 9/27]
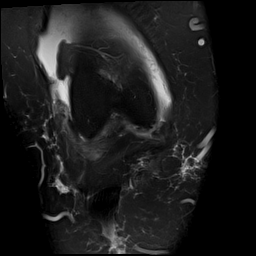
[im 14/27]
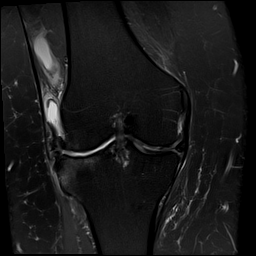
[im 18/27]
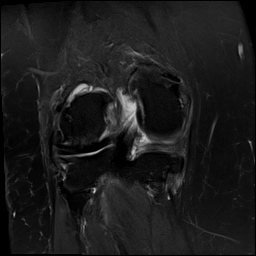
[im 22/27]
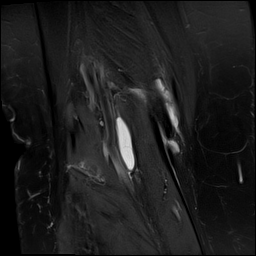
[im 27/27]
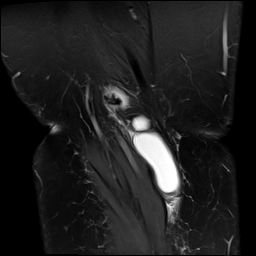

[Series 12: PD fat-sat · sagittal · right · 3.0mm · 0.52mm/px · 8 of 32 slices shown (2 of 2)]
[im 1/32]
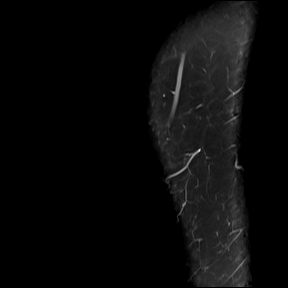
[im 5/32]
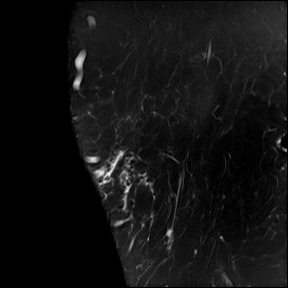
[im 9/32]
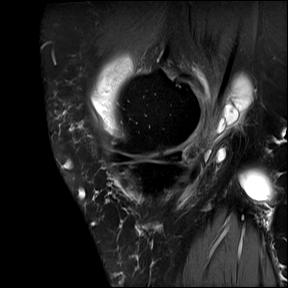
[im 14/32]
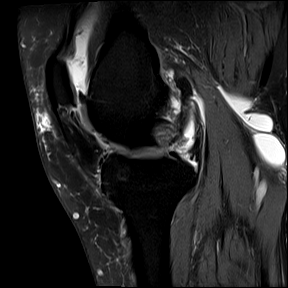
[im 18/32]
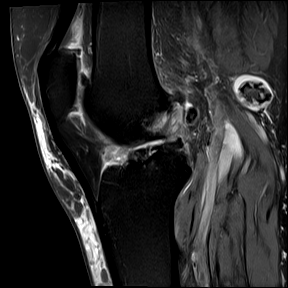
[im 23/32]
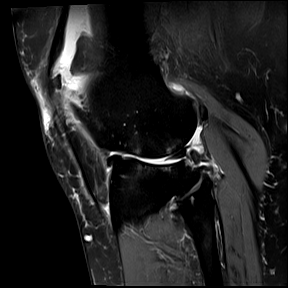
[im 27/32]
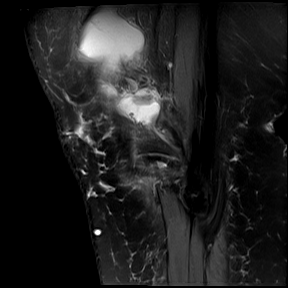
[im 32/32]
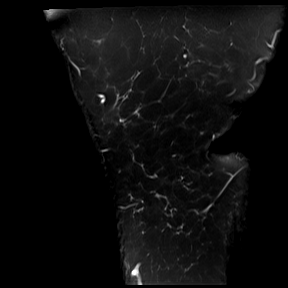

[Series 13: T2 fat-sat · sagittal · right · 3.0mm · 0.59mm/px · 7 of 28 slices shown (3 of 3)]
[im 1/28]
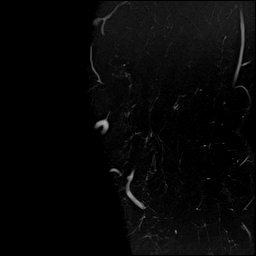
[im 5/28]
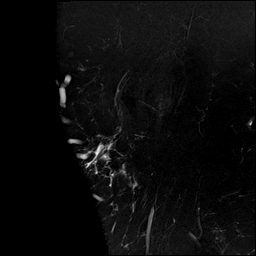
[im 10/28]
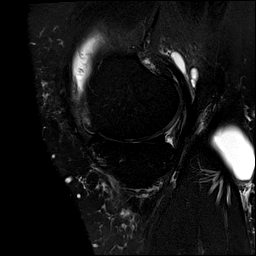
[im 14/28]
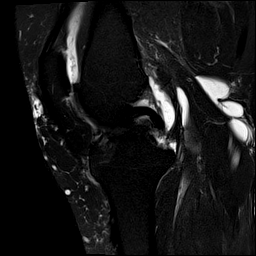
[im 19/28]
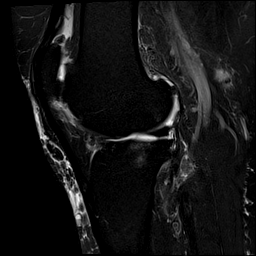
[im 23/28]
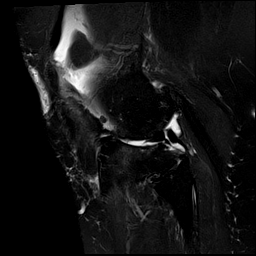
[im 28/28]
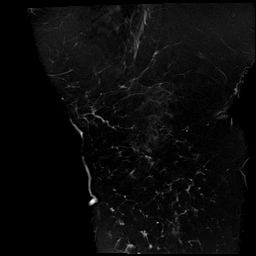

[40 of 40 positions shown; findings below may reference images not displayed]

FINDINGS: MENISCI

Medial: Degenerative changes of the body of the meniscus without
evidence of acute tear.

Lateral: Degenerative tear of the body/anterior horn of the lateral
meniscus.

LIGAMENTS

Cruciates: PCL is intact. Thickening and intermediate signal of the
ACL concerning for mucoid degeneration without tear.

Collaterals: Medial collateral ligament is intact. Lateral
collateral ligament complex is intact.

CARTILAGE

Patellofemoral: Full-thickness cartilage defect of the lateral
patellar facet. Advanced articular cartilage thinning and deep
fissuring of the trochlea with focal full-thickness defect of the
lateral trochlea.

Medial: Mild partial-thickness cartilage loss of the medial
femorotibial compartment.

Lateral: Large full-thickness cartilage loss at the weight-bearing
area with subchondral edema of the lateral femoral condyle and
tibial plateau.

JOINT: Large joint effusion with multiple small loose bodies. Normal
MAKAI. Moderate plical thickening.

POPLITEAL FOSSA: Popliteus tendon is intact. Large loculated Baker's
cyst measuring at least 3.9 x 1.7 x 4.7 cm. There is a loose body in
the Baker's cyst measuring approximately 1.5 by 1.1 cm.

EXTENSOR MECHANISM: Intact quadriceps tendon. Mild patellar
tendinopathy without tear. Intact lateral patellar retinaculum.
Intact medial patellar retinaculum. Intact MPFL.

BONES: No aggressive osseous lesion. No fracture or dislocation.
Large tricompartmental marginal osteophytes.

Other: Prepatellar subcutaneous soft tissue edema. No fluid
collection or hematoma. Muscles are normal.
IMPRESSION: 1. Advanced tricompartmental osteoarthritis, severe in the lateral
tibiofemoral compartment with degenerative tear of the meniscus,
loss of articular cartilage and subchondral edema of the femoral
condyle and lateral tibial plateau. Tricompartmental marginal
osteophytes.

2.  Large joint effusion with loose bodies and synovitis.

3. Large Baker's cyst measuring at least 3.9 x 1.7 x 4.7 cm with a
loose body.

4.  Mucoid degeneration of the ACL without tear.

5.  Nonspecific pre patellar soft tissue edema.

## 2022-04-06 ENCOUNTER — Encounter: Payer: Self-pay | Admitting: Orthopaedic Surgery

## 2022-04-06 ENCOUNTER — Ambulatory Visit (INDEPENDENT_AMBULATORY_CARE_PROVIDER_SITE_OTHER): Payer: Medicare HMO | Admitting: Orthopaedic Surgery

## 2022-04-06 VITALS — Ht 65.0 in | Wt 215.0 lb

## 2022-04-06 DIAGNOSIS — M7121 Synovial cyst of popliteal space [Baker], right knee: Secondary | ICD-10-CM | POA: Diagnosis not present

## 2022-04-06 DIAGNOSIS — M1711 Unilateral primary osteoarthritis, right knee: Secondary | ICD-10-CM | POA: Diagnosis not present

## 2022-04-06 DIAGNOSIS — M2341 Loose body in knee, right knee: Secondary | ICD-10-CM | POA: Diagnosis not present

## 2022-04-06 DIAGNOSIS — G8929 Other chronic pain: Secondary | ICD-10-CM

## 2022-04-06 NOTE — Progress Notes (Signed)
I am better.  She had MRI done of the right knee showing: IMPRESSION: 1. Advanced tricompartmental osteoarthritis, severe in the lateral tibiofemoral compartment with degenerative tear of the meniscus, loss of articular cartilage and subchondral edema of the femoral condyle and lateral tibial plateau. Tricompartmental marginal osteophytes.   2.  Large joint effusion with loose bodies and synovitis.   3. Large Baker's cyst measuring at least 3.9 x 1.7 x 4.7 cm with a loose body.   4.  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   5.  Nonspecific pre patellar soft tissue edema.he right knee showing   I have explained the findings to her.  I have told her about viscosupplementation.    She said she is much better.  She went to [place] the last week and did a lot of walking and did well.  She has less pain and less swelling.  She would like to hold off on anything now.  I am agreeable to this.  Right knee has slight effusion, crepitus, ROM 0 to110, stable.  NV intact.  Encounter Diagnosis  Name Primary?   Chronic pain of right knee Yes   I will see as needed.  I have independently reviewed the MRI.    Call if any problem.  Precautions discussed.  Electronically Signed Darreld Mclean, MD 6/7/20239:56 AM

## 2022-04-21 ENCOUNTER — Other Ambulatory Visit: Payer: Self-pay | Admitting: Family Medicine

## 2022-06-28 NOTE — Progress Notes (Unsigned)
Maria Hart is a 71 y.o. female presents to office today for annual physical exam examination.    Concerns today include: 1. ***  Occupation: ***, Marital status: ***, Substance use: *** Diet: ***, Exercise: *** Last eye exam: UTD Last dental exam: UTD Last colonoscopy: UTD Last mammogram: UTD Refills needed today: *** Immunizations needed: Immunization History  Administered Date(s) Administered   Fluad Quad(high Dose 65+) 08/13/2019, 09/11/2020, 07/28/2021   Moderna Sars-Covid-2 Vaccination 12/14/2019, 01/11/2020, 08/24/2020   PNEUMOCOCCAL CONJUGATE-20 02/23/2022   Zoster Recombinat (Shingrix) 02/23/2022     Past Medical History:  Diagnosis Date   Arthritis    Hypertension    Social History   Socioeconomic History   Marital status: Married    Spouse name: Training and development officer   Number of children: 1   Years of education: Not on file   Highest education level: High school graduate  Occupational History   Occupation: Retired  Tobacco Use   Smoking status: Never   Smokeless tobacco: Never  Vaping Use   Vaping Use: Never used  Substance and Sexual Activity   Alcohol use: Never   Drug use: Never   Sexual activity: Not Currently  Other Topics Concern   Not on file  Social History Narrative   Lives with her husband - daughter lives nearby   Social Determinants of Health   Financial Resource Strain: Low Risk  (02/25/2022)   Overall Financial Resource Strain (CARDIA)    Difficulty of Paying Living Expenses: Not hard at all  Food Insecurity: No Food Insecurity (02/25/2022)   Hunger Vital Sign    Worried About Running Out of Food in the Last Year: Never true    Ran Out of Food in the Last Year: Never true  Transportation Needs: No Transportation Needs (02/25/2022)   PRAPARE - Administrator, Civil Service (Medical): No    Lack of Transportation (Non-Medical): No  Physical Activity: Sufficiently Active (02/25/2022)   Exercise Vital Sign    Days of Exercise  per Week: 5 days    Minutes of Exercise per Session: 30 min  Stress: No Stress Concern Present (02/25/2022)   [employer] of Occupational Health - Occupational Stress Questionnaire    Feeling of Stress : Only a little  Social Connections: Socially Integrated (02/25/2022)   Social Connection and Isolation Panel [NHANES]    Frequency of Communication with Friends and Family: More than three times a week    Frequency of Social Gatherings with Friends and Family: More than three times a week    Attends Religious Services: More than 4 times per year    Active Member of [employer] or Organizations: Yes    Attends Engineer, structural: More than 4 times per year    Marital Status: Married  Catering manager Violence: Not At Risk (02/25/2022)   Humiliation, Afraid, Rape, and Kick questionnaire    Fear of Current or Ex-Partner: No    Emotionally Abused: No    Physically Abused: No    Sexually Abused: No   Past Surgical History:  Procedure Laterality Date   CYSTECTOMY     Family History  Problem Relation Age of Onset   Cancer Mother        Brain    Arthritis Father    Diabetes Father    Hypertension Sister    Hypertension Sister     Current Outpatient Medications:    Cholecalciferol (VITAMIN D3) 25 MCG (1000 UT) CAPS, Take by mouth., Disp: , Rfl:  hydrochlorothiazide (HYDRODIURIL) 25 MG tablet, TAKE 1/2 TO 1 (ONE-HALF TO ONE) TABLET BY MOUTH ONCE DAILY FOR BLOOD PRESSURE AND  SWELLING, Disp: 90 tablet, Rfl: 3   lisinopril (ZESTRIL) 40 MG tablet, Take 1 tablet by mouth once daily, Disp: 90 tablet, Rfl: 0  No Known Allergies   ROS: Review of Systems {ros; complete:30496}    Physical exam {Exam, Complete:[phone]}    Assessment/ Plan: Maria Hart here for annual physical exam.   No problem-specific Assessment & Plan notes found for this encounter.   Counseled on healthy lifestyle choices, including diet (rich in fruits, vegetables and lean meats and low in salt  and simple carbohydrates) and exercise (at least 30 minutes of moderate physical activity daily).  Patient to follow up in 1 year for annual exam or sooner if needed.  Vyctoria Dickman M. Nadine Counts, DO

## 2022-06-29 ENCOUNTER — Ambulatory Visit (INDEPENDENT_AMBULATORY_CARE_PROVIDER_SITE_OTHER): Payer: Medicare HMO | Admitting: Family Medicine

## 2022-06-29 ENCOUNTER — Encounter: Payer: Self-pay | Admitting: Family Medicine

## 2022-06-29 VITALS — BP 151/86 | HR 95 | Temp 97.6°F | Resp 20 | Ht 65.0 in | Wt 219.0 lb

## 2022-06-29 DIAGNOSIS — I129 Hypertensive chronic kidney disease with stage 1 through stage 4 chronic kidney disease, or unspecified chronic kidney disease: Secondary | ICD-10-CM

## 2022-06-29 DIAGNOSIS — Z0001 Encounter for general adult medical examination with abnormal findings: Secondary | ICD-10-CM

## 2022-06-29 DIAGNOSIS — Z0000 Encounter for general adult medical examination without abnormal findings: Secondary | ICD-10-CM

## 2022-06-29 DIAGNOSIS — N183 Chronic kidney disease, stage 3 unspecified: Secondary | ICD-10-CM | POA: Diagnosis not present

## 2022-06-29 DIAGNOSIS — Z23 Encounter for immunization: Secondary | ICD-10-CM

## 2022-06-29 LAB — BAYER DCA HB A1C WAIVED: HB A1C (BAYER DCA - WAIVED): 5.6 % (ref 4.8–5.6)

## 2022-06-29 MED ORDER — HYDROCHLOROTHIAZIDE 25 MG PO TABS: ORAL_TABLET | ORAL | 3 refills | Status: DC

## 2022-06-29 MED ORDER — OZEMPIC (0.25 OR 0.5 MG/DOSE) 2 MG/3ML ~~LOC~~ SOPN: PEN_INJECTOR | SUBCUTANEOUS | 0 refills | Status: DC

## 2022-06-29 NOTE — Addendum Note (Signed)
Addended by: Cleda Daub on: 06/29/2022 03:51 PM   Modules accepted: Orders

## 2022-06-29 NOTE — Patient Instructions (Signed)
Tips for success with Semaglutide (and by success, how not to be super sick on your stomach): Eat small meals AVOID heavy foods (fried/ high in carbs like bread, pasta, rice) AVOID carbonated beverages (soda/ beer, as these can increase bloating) DOUBLE your water intake (will help you avoid constipation/ dehydration)  Semaglutide CAN cause: Nausea Abdominal pain Increased acid reflux (sometimes presents as "sour burps") Constipation OR Diarrhea Fatigue (especially when you first start it)  

## 2022-06-30 LAB — CBC
Hematocrit: 41.5 % (ref 34.0–46.6)
Hemoglobin: 13.6 g/dL (ref 11.1–15.9)
MCH: 28.9 pg (ref 26.6–33.0)
MCHC: 32.8 g/dL (ref 31.5–35.7)
MCV: 88 fL (ref 79–97)
Platelets: 281 10*3/uL (ref 150–450)
RBC: 4.71 x10E6/uL (ref 3.77–5.28)
RDW: 11.6 % — ABNORMAL LOW (ref 11.7–15.4)
WBC: 8.1 10*3/uL (ref 3.4–10.8)

## 2022-06-30 LAB — VITAMIN D 25 HYDROXY (VIT D DEFICIENCY, FRACTURES): Vit D, 25-Hydroxy: 33.9 ng/mL (ref 30.0–100.0)

## 2022-06-30 LAB — CMP14+EGFR
ALT: 19 IU/L (ref 0–32)
AST: 20 IU/L (ref 0–40)
Albumin/Globulin Ratio: 1.9 (ref 1.2–2.2)
Albumin: 4.3 g/dL (ref 3.9–4.9)
Alkaline Phosphatase: 72 IU/L (ref 44–121)
BUN/Creatinine Ratio: 11 — ABNORMAL LOW (ref 12–28)
BUN: 12 mg/dL (ref 8–27)
Bilirubin Total: 0.8 mg/dL (ref 0.0–1.2)
CO2: 24 mmol/L (ref 20–29)
Calcium: 9.7 mg/dL (ref 8.7–10.3)
Chloride: 105 mmol/L (ref 96–106)
Creatinine, Ser: 1.05 mg/dL — ABNORMAL HIGH (ref 0.57–1.00)
Globulin, Total: 2.3 g/dL (ref 1.5–4.5)
Glucose: 94 mg/dL (ref 70–99)
Potassium: 4.7 mmol/L (ref 3.5–5.2)
Sodium: 144 mmol/L (ref 134–144)
Total Protein: 6.6 g/dL (ref 6.0–8.5)
eGFR: 58 mL/min/{1.73_m2} — ABNORMAL LOW (ref >59)

## 2022-06-30 LAB — LIPID PANEL
Chol/HDL Ratio: 3.2 ratio (ref 0.0–4.4)
Cholesterol, Total: 153 mg/dL (ref 100–199)
HDL: 48 mg/dL (ref >39)
LDL Chol Calc (NIH): 86 mg/dL (ref 0–99)
Triglycerides: 102 mg/dL (ref 0–149)
VLDL Cholesterol Cal: 19 mg/dL (ref 5–40)

## 2022-06-30 LAB — TSH: TSH: 4.4 u[IU]/mL (ref 0.450–4.500)

## 2022-07-30 ENCOUNTER — Other Ambulatory Visit: Payer: Self-pay | Admitting: Family Medicine

## 2022-09-29 ENCOUNTER — Encounter: Payer: Self-pay | Admitting: Family Medicine

## 2022-12-29 ENCOUNTER — Encounter: Payer: Self-pay | Admitting: Radiology

## 2023-01-05 ENCOUNTER — Ambulatory Visit (INDEPENDENT_AMBULATORY_CARE_PROVIDER_SITE_OTHER): Payer: Medicare HMO | Admitting: Orthopaedic Surgery

## 2023-01-05 ENCOUNTER — Encounter: Payer: Self-pay | Admitting: Orthopaedic Surgery

## 2023-01-05 VITALS — Ht 65.0 in | Wt 215.0 lb

## 2023-01-05 DIAGNOSIS — M1711 Unilateral primary osteoarthritis, right knee: Secondary | ICD-10-CM | POA: Diagnosis not present

## 2023-01-05 DIAGNOSIS — M170 Bilateral primary osteoarthritis of knee: Secondary | ICD-10-CM | POA: Diagnosis not present

## 2023-01-05 NOTE — Progress Notes (Signed)
Office Visit Note   Patient: Maria Hart           Date of Birth: 08-Jan-1951           MRN: [id number] Visit Date: 01/05/2023              Requested by: Janora Norlander, DO [place],  [place] [id number] PCP: Janora Norlander, DO   Assessment & Plan: Visit Diagnoses:  1. Bilateral primary osteoarthritis of knee     Plan: Patient tolerated the injection well right knee.  Will check her again in 3 months.  Follow-Up Instructions: Return in about 3 months (around 04/07/2023).   Orders:  Orders Placed This Encounter  Procedures   Large Joint Inj: R knee   No orders of the defined types were placed in this encounter.     Procedures: Large Joint Inj: R knee on 01/05/2023 10:22 AM Indications: pain and joint swelling Details: 22 G 1.5 in needle, anterolateral approach  Arthrogram: No  Medications: 40 mg methylPREDNISolone acetate 40 MG/ML; 0.5 mL lidocaine 1 %; 4 mL bupivacaine 0.25 % Outcome: tolerated well, no immediate complications Procedure, treatment alternatives, risks and benefits explained, specific risks discussed. Consent was given by the patient. Immediately prior to procedure a time out was called to verify the correct patient, procedure, equipment, support staff and site/side marked as required. Patient was prepped and draped in the usual sterile fashion.       Clinical Data: No additional findings.   Subjective: Chief Complaint  Patient presents with   Right Knee - Pain    HPI 72 year old female with progressive right knee arthritis.  She has not been aspirated in the past and injected with relief.  She uses Tylenol arthritis some relief still community ambulator and it does not stop her from going any place but she does have swelling and pain in that time as it catches.  Opposite left knee does not bother her as much.  MRI of the right knee 2023 reviewed which showed areas of full-thickness cartilage loss and partial-thickness cartilage  loss tricompartmental small loose bodies and moderate-sized to large Baker's cyst.  Mucoid degeneration of the ACL was also noted.  Review of Systems positive hypertension stage III kidney disease.  Patient avoids anti-inflammatories due to her kidney and hypertension.   Objective: Vital Signs: Ht 5\' 5"  (1.651 m)   Wt 215 lb (97.5 kg)   BMI 35.78 kg/m   Physical Exam Constitutional:      Appearance: She is well-developed.  HENT:     Head: Normocephalic.     Right Ear: External ear normal.     Left Ear: External ear normal. There is no impacted cerumen.  Eyes:     Pupils: Pupils are equal, round, and reactive to light.  Neck:     Thyroid: No thyromegaly.     Trachea: No tracheal deviation.  Cardiovascular:     Rate and Rhythm: Normal rate.  Pulmonary:     Effort: Pulmonary effort is normal.  Abdominal:     Palpations: Abdomen is soft.  Musculoskeletal:     Cervical back: No rigidity.  Skin:    General: Skin is warm and dry.  Neurological:     Mental Status: She is alert and oriented to person, place, and time.  Psychiatric:        Behavior: Behavior normal.     Ortho Exam Knee crepitus range of motion she has full extension flexes past 115  degrees.  Negative logroll the hips distal pulses are intact.  Specialty Comments:  No specialty comments available.  Imaging: No results found.   PMFS History: Patient Active Problem List   Diagnosis Date Noted   Hypertension 02/25/2022   Bilateral primary osteoarthritis of knee 05/07/2020   Benign hypertension with CKD (chronic kidney disease) stage III ([place]) 02/26/2020   Past Medical History:  Diagnosis Date   Arthritis    Hypertension     Family History  Problem Relation Age of Onset   Cancer Mother        Brain    Arthritis Father    Diabetes Father    Hypertension Sister    Hypertension Sister     Past Surgical History:  Procedure Laterality Date   CYSTECTOMY     Social History   Occupational History    Occupation: Retired  Tobacco Use   Smoking status: Never   Smokeless tobacco: Never  Vaping Use   Vaping Use: Never used  Substance and Sexual Activity   Alcohol use: Never   Drug use: Never   Sexual activity: Not Currently

## 2023-01-06 MED ORDER — LIDOCAINE HCL 1 % IJ SOLN
0.5000 mL | INTRAMUSCULAR | Status: AC | PRN
  Administered 2023-01-05: .5 mL

## 2023-01-06 MED ORDER — METHYLPREDNISOLONE ACETATE 40 MG/ML IJ SUSP
40.0000 mg | INTRAMUSCULAR | Status: AC | PRN
  Administered 2023-01-05: 40 mg via INTRA_ARTICULAR

## 2023-01-06 MED ORDER — BUPIVACAINE HCL 0.25 % IJ SOLN
4.0000 mL | INTRAMUSCULAR | Status: AC | PRN
  Administered 2023-01-05: 4 mL via INTRA_ARTICULAR

## 2023-01-31 ENCOUNTER — Other Ambulatory Visit: Payer: Self-pay | Admitting: Family Medicine

## 2023-02-28 NOTE — Patient Instructions (Signed)
Maria Hart , Thank you for taking time to come for your Medicare Wellness Visit. I appreciate your ongoing commitment to your health goals. Please review the following plan we discussed and let me know if I can assist you in the future.   These are the goals we discussed:  Goals      Remain active and independent        This is a list of the screening recommended for you and due dates:  Health Maintenance  Topic Date Due   DTaP/Tdap/Td vaccine (1 - Tdap) Never done   COVID-19 Vaccine (4 - 2023-24 season) 07/01/2022   Flu Shot  06/01/2023   Mammogram  09/28/2023   Medicare Annual Wellness Visit  02/29/2024   Cologuard (Stool DNA test)  03/11/2025   DEXA scan (bone density measurement)  08/04/2025   Pneumonia Vaccine  Completed   Hepatitis C Screening: USPSTF Recommendation to screen - Ages 41-79 yo.  Completed   Zoster (Shingles) Vaccine  Completed   HPV Vaccine  Aged Out    Advanced directives: Please bring a copy of your health care power of attorney and living will to the office to be added to your chart at your convenience.   Conditions/risks identified: Aim for 30 minutes of exercise or brisk walking, 6-8 glasses of water, and 5 servings of fruits and vegetables each day.   Next appointment: Follow up in one year for your annual wellness visit    Preventive Care 65 Years and Older, Female Preventive care refers to lifestyle choices and visits with your health care provider that can promote health and wellness. What does preventive care include? A yearly physical exam. This is also called an annual well check. Dental exams once or twice a year. Routine eye exams. Ask your health care provider how often you should have your eyes checked. Personal lifestyle choices, including: Daily care of your teeth and gums. Regular physical activity. Eating a healthy diet. Avoiding tobacco and drug use. Limiting alcohol use. Practicing safe sex. Taking low-dose aspirin every  day. Taking vitamin and mineral supplements as recommended by your health care provider. What happens during an annual well check? The services and screenings done by your health care provider during your annual well check will depend on your age, overall health, lifestyle risk factors, and family history of disease. Counseling  Your health care provider may ask you questions about your: Alcohol use. Tobacco use. Drug use. Emotional well-being. Home and relationship well-being. Sexual activity. Eating habits. History of falls. Memory and ability to understand (cognition). Work and work Astronomer. Reproductive health. Screening  You may have the following tests or measurements: Height, weight, and BMI. Blood pressure. Lipid and cholesterol levels. These may be checked every 5 years, or more frequently if you are over 53 years old. Skin check. Lung cancer screening. You may have this screening every year starting at age 81 if you have a 30-pack-year history of smoking and currently smoke or have quit within the past 15 years. Fecal occult blood test (FOBT) of the stool. You may have this test every year starting at age 20. Flexible sigmoidoscopy or colonoscopy. You may have a sigmoidoscopy every 5 years or a colonoscopy every 10 years starting at age 75. Hepatitis C blood test. Hepatitis B blood test. Sexually transmitted disease (STD) testing. Diabetes screening. This is done by checking your blood sugar (glucose) after you have not eaten for a while (fasting). You may have this done every 1-3 years. Bone  density scan. This is done to screen for osteoporosis. You may have this done starting at age 75. Mammogram. This may be done every 1-2 years. Talk to your health care provider about how often you should have regular mammograms. Talk with your health care provider about your test results, treatment options, and if necessary, the need for more tests. Vaccines  Your health care  provider may recommend certain vaccines, such as: Influenza vaccine. This is recommended every year. Tetanus, diphtheria, and acellular pertussis (Tdap, Td) vaccine. You may need a Td booster every 10 years. Zoster vaccine. You may need this after age 27. Pneumococcal 13-valent conjugate (PCV13) vaccine. One dose is recommended after age 37. Pneumococcal polysaccharide (PPSV23) vaccine. One dose is recommended after age 76. Talk to your health care provider about which screenings and vaccines you need and how often you need them. This information is not intended to replace advice given to you by your health care provider. Make sure you discuss any questions you have with your health care provider. Document Released: 11/13/2015 Document Revised: 07/06/2016 Document Reviewed: 08/18/2015 Elsevier Interactive Patient Education  2017 [place] Prevention in the Home Falls can cause injuries. They can happen to people of all ages. There are many things you can do to make your home safe and to help prevent falls. What can I do on the outside of my home? Regularly fix the edges of walkways and driveways and fix any cracks. Remove anything that might make you trip as you walk through a door, such as a raised step or threshold. Trim any bushes or trees on the path to your home. Use bright outdoor lighting. Clear any walking paths of anything that might make someone trip, such as rocks or tools. Regularly check to see if handrails are loose or broken. Make sure that both sides of any steps have handrails. Any raised decks and porches should have guardrails on the edges. Have any leaves, snow, or ice cleared regularly. Use sand or salt on walking paths during winter. Clean up any spills in your garage right away. This includes oil or grease spills. What can I do in the bathroom? Use night lights. Install grab bars by the toilet and in the tub and shower. Do not use towel bars as grab  bars. Use non-skid mats or decals in the tub or shower. If you need to sit down in the shower, use a plastic, non-slip stool. Keep the floor dry. Clean up any water that spills on the floor as soon as it happens. Remove soap buildup in the tub or shower regularly. Attach bath mats securely with double-sided non-slip rug tape. Do not have throw rugs and other things on the floor that can make you trip. What can I do in the bedroom? Use night lights. Make sure that you have a light by your bed that is easy to reach. Do not use any sheets or blankets that are too big for your bed. They should not hang down onto the floor. Have a firm chair that has side arms. You can use this for support while you get dressed. Do not have throw rugs and other things on the floor that can make you trip. What can I do in the kitchen? Clean up any spills right away. Avoid walking on wet floors. Keep items that you use a lot in easy-to-reach places. If you need to reach something above you, use a strong step stool that has a grab bar. Keep  electrical cords out of the way. Do not use floor polish or wax that makes floors slippery. If you must use wax, use non-skid floor wax. Do not have throw rugs and other things on the floor that can make you trip. What can I do with my stairs? Do not leave any items on the stairs. Make sure that there are handrails on both sides of the stairs and use them. Fix handrails that are broken or loose. Make sure that handrails are as long as the stairways. Check any carpeting to make sure that it is firmly attached to the stairs. Fix any carpet that is loose or worn. Avoid having throw rugs at the top or bottom of the stairs. If you do have throw rugs, attach them to the floor with carpet tape. Make sure that you have a light switch at the top of the stairs and the bottom of the stairs. If you do not have them, ask someone to add them for you. What else can I do to help prevent  falls? Wear shoes that: Do not have high heels. Have rubber bottoms. Are comfortable and fit you well. Are closed at the toe. Do not wear sandals. If you use a stepladder: Make sure that it is fully opened. Do not climb a closed stepladder. Make sure that both sides of the stepladder are locked into place. Ask someone to hold it for you, if possible. Clearly mark and make sure that you can see: Any grab bars or handrails. First and last steps. Where the edge of each step is. Use tools that help you move around (mobility aids) if they are needed. These include: Canes. Walkers. Scooters. Crutches. Turn on the lights when you go into a dark area. Replace any light bulbs as soon as they burn out. Set up your furniture so you have a clear path. Avoid moving your furniture around. If any of your floors are uneven, fix them. If there are any pets around you, be aware of where they are. Review your medicines with your doctor. Some medicines can make you feel dizzy. This can increase your chance of falling. Ask your doctor what other things that you can do to help prevent falls. This information is not intended to replace advice given to you by your health care provider. Make sure you discuss any questions you have with your health care provider. Document Released: 08/13/2009 Document Revised: 03/24/2016 Document Reviewed: 11/21/2014 Elsevier Interactive Patient Education  2017 [employer].

## 2023-02-28 NOTE — Progress Notes (Unsigned)
Subjective:   Maria Hart is a 72 y.o. female who presents for Medicare Annual (Subsequent) preventive examination.  Review of Systems    ***       Objective:    There were no vitals filed for this visit. There is no height or weight on file to calculate BMI.     02/25/2022   11:33 AM 02/24/2021    1:46 PM 02/24/2020    1:41 PM  Advanced Directives  Does Patient Have a Medical Advance Directive? Yes No Yes  Type of Estate agent of [place];Living will  Living will  Copy of Healthcare Power of Attorney in Chart? No - copy requested    Would patient like information on creating a medical advance directive?  Yes (MAU/Ambulatory/Procedural Areas - Information given)     Current Medications (verified) Outpatient Encounter Medications as of 03/01/2023  Medication Sig   Cholecalciferol (VITAMIN D3) 25 MCG (1000 UT) CAPS Take by mouth.   hydrochlorothiazide (HYDRODIURIL) 25 MG tablet TAKE 1 TABLET BY MOUTH ONCE DAILY FOR BLOOD PRESSURE   lisinopril (ZESTRIL) 40 MG tablet Take 1 tablet (40 mg total) by mouth daily. (NEEDS TO BE SEEN BEFORE NEXT REFILL)   Semaglutide,0.25 or 0.5MG /DOS, (OZEMPIC, 0.25 OR 0.5 MG/DOSE,) 2 MG/3ML SOPN Semaglutide:B12 from [facility] drug   No facility-administered encounter medications on file as of 03/01/2023.    Allergies (verified) Patient has no known allergies.   History: Past Medical History:  Diagnosis Date   Arthritis    Hypertension    Past Surgical History:  Procedure Laterality Date   CYSTECTOMY     Family History  Problem Relation Age of Onset   Cancer Mother        Brain    Arthritis Father    Diabetes Father    Hypertension Sister    Hypertension Sister    Social History   Socioeconomic History   Marital status: Married    Spouse name: Training and development officer   Number of children: 1   Years of education: Not on file   Highest education level: High school graduate  Occupational History   Occupation: Retired   Tobacco Use   Smoking status: Never   Smokeless tobacco: Never  Building services engineer Use: Never used  Substance and Sexual Activity   Alcohol use: Never   Drug use: Never   Sexual activity: Not Currently  Other Topics Concern   Not on file  Social History Narrative   Lives with her husband - daughter lives nearby   Social Determinants of Health   Financial Resource Strain: Low Risk  (02/25/2022)   Overall Financial Resource Strain (CARDIA)    Difficulty of Paying Living Expenses: Not hard at all  Food Insecurity: No Food Insecurity (02/25/2022)   Hunger Vital Sign    Worried About Running Out of Food in the Last Year: Never true    Ran Out of Food in the Last Year: Never true  Transportation Needs: No Transportation Needs (02/25/2022)   PRAPARE - Administrator, Civil Service (Medical): No    Lack of Transportation (Non-Medical): No  Physical Activity: Sufficiently Active (02/25/2022)   Exercise Vital Sign    Days of Exercise per Week: 5 days    Minutes of Exercise per Session: 30 min  Stress: No Stress Concern Present (02/25/2022)   [employer] of Occupational Health - Occupational Stress Questionnaire    Feeling of Stress : Only a little  Social Connections: Socially Integrated (  02/25/2022)   Social Connection and Isolation Panel [NHANES]    Frequency of Communication with Friends and Family: More than three times a week    Frequency of Social Gatherings with Friends and Family: More than three times a week    Attends Religious Services: More than 4 times per year    Active Member of [employer] or Organizations: Yes    Attends Engineer, structural: More than 4 times per year    Marital Status: Married    Tobacco Counseling Counseling given: Not Answered   Clinical Intake:                 Diabetic?No          Activities of Daily Living     No data to display          Patient Care Team: [name], DO as PCP -  General (Family Medicine) Delora Fuel, OD (Optometry)  Indicate any recent Medical Services you may have received from other than [facility] providers in the past year (date may be approximate).     Assessment:   This is a routine wellness examination for Maria Hart.  Hearing/Vision screen No results found.  Dietary issues and exercise activities discussed:     Goals Addressed   None    Depression Screen    06/29/2022   10:28 AM 02/25/2022   11:20 AM 02/23/2022    2:52 PM 07/28/2021    1:08 PM 04/12/2021    9:29 AM 02/24/2021    1:39 PM 01/06/2021    8:25 AM  PHQ 2/9 Scores  PHQ - 2 Score 0 0 0 0 0 0 0  PHQ- 9 Score 2    2 0 0    Fall Risk    06/29/2022   10:28 AM 02/25/2022   11:19 AM 02/23/2022    2:52 PM 07/28/2021    1:08 PM 04/12/2021    9:29 AM  Fall Risk   Falls in the past year? 0 1 1 1 1   Number falls in past yr:  0 1 1 0  Injury with Fall?  0 0 0 1  Risk for fall due to :  History of fall(s);Orthopedic patient History of fall(s)    Follow up  Education provided;Falls prevention discussed Education provided      FALL RISK PREVENTION PERTAINING TO THE HOME:  Any stairs in or around the home? {YES/NO:21197} If so, are there any without handrails? {YES/NO:21197} Home free of loose throw rugs in walkways, pet beds, electrical cords, etc? {YES/NO:21197} Adequate lighting in your home to reduce risk of falls? {YES/NO:21197}  ASSISTIVE DEVICES UTILIZED TO PREVENT FALLS:  Life alert? {YES/NO:21197} Use of a cane, walker or w/c? {YES/NO:21197} Grab bars in the bathroom? {YES/NO:21197} Shower chair or bench in shower? {YES/NO:21197} Elevated toilet seat or a handicapped toilet? {YES/NO:21197}  TIMED UP AND GO:  Was the test performed? No . Telephonic visit  Cognitive Function:        02/25/2022   11:22 AM 02/24/2020    1:44 PM  6CIT Screen  What Year? 0 points 0 points  What month? 0 points 0 points  What time? 0 points 0 points  Count back from 20 0 points 0  points  Months in reverse 0 points 0 points  Repeat phrase 6 points 2 points  Total Score 6 points 2 points    Immunizations Immunization History  Administered Date(s) Administered   Fluad Quad(high Dose 65+) 08/13/2019, 09/11/2020, 07/28/2021  Moderna Sars-Covid-2 Vaccination 12/14/2019, 01/11/2020, 08/24/2020   PNEUMOCOCCAL CONJUGATE-20 02/23/2022   Zoster Recombinat (Shingrix) 02/23/2022, 06/29/2022    TDAP status: Due, Education has been provided regarding the importance of this vaccine. Advised may receive this vaccine at local pharmacy or Health Dept. Aware to provide a copy of the vaccination record if obtained from local pharmacy or Health Dept. Verbalized acceptance and understanding.  Flu Vaccine status: Up to date  Pneumococcal vaccine status: Up to date  Covid-19 vaccine status: Information provided on how to obtain vaccines.   Qualifies for Shingles Vaccine? Yes   Zostavax completed No   Shingrix Completed?: Yes  Screening Tests Health Maintenance  Topic Date Due   DTaP/Tdap/Td (1 - Tdap) Never done   COVID-19 Vaccine (4 - 2023-24 season) 07/01/2022   Medicare Annual Wellness (AWV)  02/26/2023   INFLUENZA VACCINE  06/01/2023   MAMMOGRAM  09/28/2023   Fecal DNA (Cologuard)  03/11/2025   DEXA SCAN  08/04/2025   Pneumonia Vaccine 42+ Years old  Completed   Hepatitis C Screening  Completed   Zoster Vaccines- Shingrix  Completed   HPV VACCINES  Aged Out    Health Maintenance  Health Maintenance Due  Topic Date Due   DTaP/Tdap/Td (1 - Tdap) Never done   COVID-19 Vaccine (4 - 2023-24 season) 07/01/2022   Medicare Annual Wellness (AWV)  02/26/2023    Colorectal cancer screening: Type of screening: Cologuard. Completed 03/11/22. Repeat every 3 years  Mammogram status: Completed 09/27/22. Repeat every year  Bone Density status: Completed 08/04/20. Results reflect: Bone density results: NORMAL. Repeat every 5 years.  Lung Cancer Screening: (Low Dose CT Chest  recommended if Age 51-80 years, 30 pack-year currently smoking OR have quit w/in 15years.) does not qualify.   Lung Cancer Screening Referral: n/a  Additional Screening:  Hepatitis C Screening: does qualify; Completed 02/26/20  Vision Screening: Recommended annual ophthalmology exams for early detection of glaucoma and other disorders of the eye. Is the patient up to date with their annual eye exam?  {YES/NO:21197} Who is the provider or what is the name of the office in which the patient attends annual eye exams? *** If pt is not established with a provider, would they like to be referred to a provider to establish care? {YES/NO:21197}.   Dental Screening: Recommended annual dental exams for proper oral hygiene  Community Resource Referral / Chronic Care Management: CRR required this visit?  {YES/NO:21197}  CCM required this visit?  {YES/NO:21197}     Plan:     I have personally reviewed and noted the following in the patient's chart:   Medical and social history Use of alcohol, tobacco or illicit drugs  Current medications and supplements including opioid prescriptions. {Opioid [phone]} Functional ability and status Nutritional status Physical activity Advanced directives List of other physicians Hospitalizations, surgeries, and ER visits in previous 12 months Vitals Screenings to include cognitive, depression, and falls Referrals and appointments  In addition, I have reviewed and discussed with patient certain preventive protocols, quality metrics, and best practice recommendations. A written personalized care plan for preventive services as well as general preventive health recommendations were provided to patient.     Durwin Nora, [place]   1/61/0960   Due to this being a virtual visit, the after visit summary with patients personalized plan was offered to patient via mail or my-chart. ***Patient declined at this time./ Patient would like  to access on my-chart/ per request, patient was mailed a copy of AVS./ Patient preferred  to pick up at office at next visit  Nurse Notes: ***

## 2023-03-01 ENCOUNTER — Ambulatory Visit (INDEPENDENT_AMBULATORY_CARE_PROVIDER_SITE_OTHER): Payer: Medicare HMO

## 2023-03-01 VITALS — Ht 65.0 in | Wt 215.0 lb

## 2023-03-01 DIAGNOSIS — Z0000 Encounter for general adult medical examination without abnormal findings: Secondary | ICD-10-CM

## 2023-03-06 ENCOUNTER — Other Ambulatory Visit: Payer: Self-pay | Admitting: Family Medicine

## 2023-04-06 ENCOUNTER — Encounter: Payer: Self-pay | Admitting: Orthopaedic Surgery

## 2023-04-06 ENCOUNTER — Ambulatory Visit (INDEPENDENT_AMBULATORY_CARE_PROVIDER_SITE_OTHER): Payer: Medicare HMO | Admitting: Orthopaedic Surgery

## 2023-04-06 VITALS — Ht 65.0 in | Wt 215.0 lb

## 2023-04-06 DIAGNOSIS — M170 Bilateral primary osteoarthritis of knee: Secondary | ICD-10-CM

## 2023-04-06 NOTE — Progress Notes (Signed)
Office Visit Note   Patient: Maria Hart           Date of Birth: 05-24-1951           MRN: [id number] Visit Date: 04/06/2023              Requested by: [name], DO [address] [place],  [place] [address] PCP: [name], DO   Assessment & Plan: Visit Diagnoses:  1. Bilateral primary osteoarthritis of knee     Plan: Today we discussed total knee arthroplasty.  Voltaren gel prescription written with several refills recheck 3 months.  Follow-Up Instructions: Return in about 3 months (around 07/07/2023).   Orders:  No orders of the defined types were placed in this encounter.  No orders of the defined types were placed in this encounter.     Procedures: No procedures performed   Clinical Data: No additional findings.   Subjective: Chief Complaint  Patient presents with   Right Knee - Pain, Follow-up    HPI follow-up bilateral knee arthritis right worse than left.  Previous injection 3 months ago gave her a little bit of relief not significant.  She still has pain she still goes every place she wants.  She notices swelling when she is more active.  She is use Tylenol.  She used some of her husbands Voltaren gel and is requesting a prescription.  She has stage III kidney disease and hypertension.  Review of Systems All other systems updated unchanged.  Objective: Vital Signs: Ht 5\' 5"  (1.651 m)   Wt 215 lb (97.5 kg)   BMI 35.78 kg/m   Physical Exam Constitutional:      Appearance: She is well-developed.  HENT:     Head: Normocephalic.     Right Ear: External ear normal.     Left Ear: External ear normal. There is no impacted cerumen.  Eyes:     Pupils: Pupils are equal, round, and reactive to light.  Neck:     Thyroid: No thyromegaly.     Trachea: No tracheal deviation.  Cardiovascular:     Rate and Rhythm: Normal rate.  Pulmonary:     Effort: Pulmonary effort is normal.  Abdominal:     Palpations: Abdomen is soft.   Musculoskeletal:     Cervical back: No rigidity.  Skin:    General: Skin is warm and dry.  Neurological:     Mental Status: She is alert and oriented to person, place, and time.  Psychiatric:        Behavior: Behavior normal.     Ortho Exam mild genu valgus on right knee.  More lateral than medial joint line tenderness 2+ knee effusion.  Negative logroll hips pulses are intact.  She is amatory slight right knee limp.  Specialty Comments:  No specialty comments available.  Imaging: No results found.   PMFS History: Patient Active Problem List   Diagnosis Date Noted   Hypertension 02/25/2022   Bilateral primary osteoarthritis of knee 05/07/2020   Benign hypertension with CKD (chronic kidney disease) stage III (HCC) 02/26/2020   Past Medical History:  Diagnosis Date   Arthritis    Hypertension     Family History  Problem Relation Age of Onset   Cancer Mother        Brain    Arthritis Father    Diabetes Father    Hypertension Sister    Hypertension Sister     Past Surgical History:  Procedure Laterality  Date   CYSTECTOMY     Social History   Occupational History   Occupation: Retired  Tobacco Use   Smoking status: Never   Smokeless tobacco: Never  Vaping Use   Vaping Use: Never used  Substance and Sexual Activity   Alcohol use: Never   Drug use: Never   Sexual activity: Not Currently

## 2023-04-07 ENCOUNTER — Ambulatory Visit (INDEPENDENT_AMBULATORY_CARE_PROVIDER_SITE_OTHER): Payer: Medicare HMO | Admitting: Family Medicine

## 2023-04-07 ENCOUNTER — Encounter: Payer: Self-pay | Admitting: Family Medicine

## 2023-04-07 VITALS — BP 141/78 | HR 84 | Temp 98.5°F | Ht 65.0 in | Wt 217.0 lb

## 2023-04-07 DIAGNOSIS — G8929 Other chronic pain: Secondary | ICD-10-CM

## 2023-04-07 DIAGNOSIS — I129 Hypertensive chronic kidney disease with stage 1 through stage 4 chronic kidney disease, or unspecified chronic kidney disease: Secondary | ICD-10-CM | POA: Diagnosis not present

## 2023-04-07 DIAGNOSIS — M25561 Pain in right knee: Secondary | ICD-10-CM | POA: Diagnosis not present

## 2023-04-07 DIAGNOSIS — N183 Chronic kidney disease, stage 3 unspecified: Secondary | ICD-10-CM

## 2023-04-07 MED ORDER — HYDROCHLOROTHIAZIDE 25 MG PO TABS: ORAL_TABLET | ORAL | 3 refills | Status: DC

## 2023-04-07 MED ORDER — LISINOPRIL 40 MG PO TABS: 40.0000 mg | ORAL_TABLET | Freq: Every day | ORAL | 3 refills | Status: DC

## 2023-04-07 NOTE — Patient Instructions (Signed)
Let me know if you decide you want a referral for your knee Let me know if you decide you want the compound shot from [facility].

## 2023-04-07 NOTE — Progress Notes (Signed)
Subjective: CC:HTN PCP: [name], DO [id number] Z Forlenza is a 72 y.o. female presenting to clinic today for:  1.  Hypertension associated with CKD 3 Patient reports compliance with medications.  She is needing a renewal on lisinopril however.  No chest pain, shortness of breath.  She trialed the Ozempic compound sublingually and only lost about 4 pounds and did not feel that it was worth the cost.  She continues to try and lose weight as she continues to have a right-sided knee pain.  She does report the topical Voltaren gel does seem to alleviate some of the discomfort and she is trying to hold off on any surgical interventions for as long as possible.  She may consider seeing a second opinion for that right knee as she is interested in potentially doing viscosupplementation before ever considering surgery   ROS: Per HPI  No Known Allergies Past Medical History:  Diagnosis Date   Arthritis    Hypertension     Current Outpatient Medications:    Cholecalciferol (VITAMIN D3) 25 MCG (1000 UT) CAPS, Take by mouth., Disp: , Rfl:    Semaglutide,0.25 or 0.5MG /DOS, (OZEMPIC, 0.25 OR 0.5 MG/DOSE,) 2 MG/3ML SOPN, Semaglutide:B12 from [facility] drug, Disp: 9 mL, Rfl: 0   hydrochlorothiazide (HYDRODIURIL) 25 MG tablet, TAKE 1 TABLET BY MOUTH ONCE DAILY FOR BLOOD PRESSURE, Disp: 90 tablet, Rfl: 3   lisinopril (ZESTRIL) 40 MG tablet, Take 1 tablet (40 mg total) by mouth daily., Disp: 90 tablet, Rfl: 3 Social History   Socioeconomic History   Marital status: Married    Spouse name: Training and development officer   Number of children: 1   Years of education: Not on file   Highest education level: High school graduate  Occupational History   Occupation: Retired  Tobacco Use   Smoking status: Never   Smokeless tobacco: Never  Vaping Use   Vaping Use: Never used  Substance and Sexual Activity   Alcohol use: Never   Drug use: Never   Sexual activity: Not Currently  Other Topics Concern   Not on file   Social History Narrative   Lives with her husband - daughter lives nearby   Social Determinants of Health   Financial Resource Strain: Low Risk  (03/01/2023)   Overall Financial Resource Strain (CARDIA)    Difficulty of Paying Living Expenses: Not hard at all  Food Insecurity: No Food Insecurity (03/01/2023)   Hunger Vital Sign    Worried About Running Out of Food in the Last Year: Never true    Ran Out of Food in the Last Year: Never true  Transportation Needs: No Transportation Needs (03/01/2023)   PRAPARE - Administrator, Civil Service (Medical): No    Lack of Transportation (Non-Medical): No  Physical Activity: Sufficiently Active (03/01/2023)   Exercise Vital Sign    Days of Exercise per Week: 5 days    Minutes of Exercise per Session: 30 min  Stress: No Stress Concern Present (03/01/2023)   [employer] of Occupational Health - Occupational Stress Questionnaire    Feeling of Stress : Not at all  Social Connections: Socially Integrated (03/01/2023)   Social Connection and Isolation Panel [NHANES]    Frequency of Communication with Friends and Family: More than three times a week    Frequency of Social Gatherings with Friends and Family: More than three times a week    Attends Religious Services: More than 4 times per year    Active Member of [employer] or Organizations:  Yes    Attends Club or Organization Meetings: More than 4 times per year    Marital Status: Married  Catering manager Violence: Not At Risk (03/01/2023)   Humiliation, Afraid, Rape, and Kick questionnaire    Fear of Current or Ex-Partner: No    Emotionally Abused: No    Physically Abused: No    Sexually Abused: No   Family History  Problem Relation Age of Onset   Cancer Mother        Brain    Arthritis Father    Diabetes Father    Hypertension Sister    Hypertension Sister     Objective: Office vital signs reviewed. BP (!) 141/78   Pulse 84   Temp 98.5 F (36.9 C)   Ht 5\' 5"  (1.651 m)   Wt  217 lb (98.4 kg)   SpO2 100%   BMI 36.11 kg/m   Physical Examination:  General: Awake, alert, morbidly obese, No acute distress HEENT: sclera white, MMM Cardio: regular rate and rhythm, S1S2 heard, no murmurs appreciated Pulm: clear to auscultation bilaterally, no wheezes, rhonchi or rales; normal work of breathing on room air MSK: Ambulating independently.  She has some stiffness when rising from a seated position.  No gross swelling, erythema or warmth to the right knee  Assessment/ Plan: 72 y.o. female   Benign hypertension with CKD (chronic kidney disease) stage III (HCC) - Plan: hydrochlorothiazide (HYDRODIURIL) 25 MG tablet, lisinopril (ZESTRIL) 40 MG tablet, Renal Function Panel  Chronic pain of right knee  Morbid obesity (HCC)  HCTZ and lisinopril renewed.  Check renal function.  Blood pressure not at goal.  I would prefer to have her less than 140/90 given CKD.  She will monitor blood pressures daily for the next 2 weeks.  If blood pressures remain elevated, we may need to consider adjusting medications  She will let me know if she decides to seek viscosupplementation referral.  We discussed alternative therapies to sublingual semaglutide including injectable semaglutide compound.  She will let me know if she decides to proceed with that.  Continue lifestyle modification to reduce weight  Orders Placed This Encounter  Procedures   Renal Function Panel   Meds ordered this encounter  Medications   hydrochlorothiazide (HYDRODIURIL) 25 MG tablet    Sig: TAKE 1 TABLET BY MOUTH ONCE DAILY FOR BLOOD PRESSURE    Dispense:  90 tablet    Refill:  3   lisinopril (ZESTRIL) 40 MG tablet    Sig: Take 1 tablet (40 mg total) by mouth daily.    Dispense:  90 tablet    Refill:  3     Tyrese Ficek Hulen Skains, DO [facility] [phone]

## 2023-05-11 ENCOUNTER — Ambulatory Visit: Payer: Medicare HMO | Admitting: Orthopaedic Surgery

## 2023-07-06 ENCOUNTER — Ambulatory Visit: Payer: Medicare HMO | Admitting: Orthopaedic Surgery

## 2023-10-03 LAB — HM MAMMOGRAPHY

## 2023-10-04 ENCOUNTER — Encounter: Payer: Self-pay | Admitting: Family Medicine

## 2023-11-13 ENCOUNTER — Ambulatory Visit (INDEPENDENT_AMBULATORY_CARE_PROVIDER_SITE_OTHER): Payer: Medicare HMO | Admitting: Family Medicine

## 2023-11-13 ENCOUNTER — Encounter: Payer: Self-pay | Admitting: Family Medicine

## 2023-11-13 VITALS — BP 131/75 | HR 88 | Temp 98.7°F | Ht 65.0 in | Wt 215.2 lb

## 2023-11-13 DIAGNOSIS — I129 Hypertensive chronic kidney disease with stage 1 through stage 4 chronic kidney disease, or unspecified chronic kidney disease: Secondary | ICD-10-CM | POA: Diagnosis not present

## 2023-11-13 DIAGNOSIS — N183 Chronic kidney disease, stage 3 unspecified: Secondary | ICD-10-CM

## 2023-11-13 DIAGNOSIS — Z0000 Encounter for general adult medical examination without abnormal findings: Secondary | ICD-10-CM

## 2023-11-13 DIAGNOSIS — Z0001 Encounter for general adult medical examination with abnormal findings: Secondary | ICD-10-CM

## 2023-11-13 DIAGNOSIS — Z6835 Body mass index (BMI) 35.0-35.9, adult: Secondary | ICD-10-CM

## 2023-11-13 LAB — BAYER DCA HB A1C WAIVED: HB A1C (BAYER DCA - WAIVED): 5.4 % (ref 4.8–5.6)

## 2023-11-13 MED ORDER — LISINOPRIL 40 MG PO TABS: 40.0000 mg | ORAL_TABLET | Freq: Every day | ORAL | 3 refills | Status: DC

## 2023-11-13 MED ORDER — HYDROCHLOROTHIAZIDE 25 MG PO TABS: ORAL_TABLET | ORAL | 3 refills | Status: DC

## 2023-11-13 NOTE — Progress Notes (Signed)
 Maria Hart is a 73 y.o. female presents to office today for annual physical exam examination.    Concerns today include: 1.  None.  She reports that she has been doing relatively well.  She has been really reducing her salt intake in efforts to reduce blood pressure and has been successful with this.  She is going to the [place] with her husband tomorrow to talk to nutritionist as he is borderline diabetic and she is hoping that this may give her some good insight on how to prepare more healthy meals.  Her physical activity is still limited secondary to chronic right knee pain but she will be seeing her orthopedist, a new one, Dr. Heyward in [place], today.  She has had 1 steroid shot and that seem to work for about 3 months.  Occupation: retired, Marital status: married, Substance use: none Health Maintenance Due  Topic Date Due   DTaP/Tdap/Td (1 - Tdap) Never done   INFLUENZA VACCINE  06/01/2023   COVID-19 Vaccine (4 - 2024-25 season) 07/02/2023   Refills needed today: all  Immunization History  Administered Date(s) Administered   Fluad Quad(high Dose 65+) 08/13/2019, 09/11/2020, 07/28/2021   Moderna Sars-Covid-2 Vaccination 12/14/2019, 01/11/2020, 08/24/2020   PNEUMOCOCCAL CONJUGATE-20 02/23/2022   Zoster Recombinant(Shingrix ) 02/23/2022, 06/29/2022   Past Medical History:  Diagnosis Date   Arthritis    Hypertension    Social History   Socioeconomic History   Marital status: Married    Spouse name: Training And Development Officer   Number of children: 1   Years of education: Not on file   Highest education level: High school graduate  Occupational History   Occupation: Retired  Tobacco Use   Smoking status: Never   Smokeless tobacco: Never  Vaping Use   Vaping status: Never Used  Substance and Sexual Activity   Alcohol use: Never   Drug use: Never   Sexual activity: Not Currently  Other Topics Concern   Not on file  Social History Narrative   Lives with her husband - daughter lives  nearby   Social Drivers of Health   Financial Resource Strain: Low Risk  (03/01/2023)   Overall Financial Resource Strain (CARDIA)    Difficulty of Paying Living Expenses: Not hard at all  Food Insecurity: No Food Insecurity (03/01/2023)   Hunger Vital Sign    Worried About Running Out of Food in the Last Year: Never true    Ran Out of Food in the Last Year: Never true  Transportation Needs: No Transportation Needs (03/01/2023)   PRAPARE - Administrator, Civil Service (Medical): No    Lack of Transportation (Non-Medical): No  Physical Activity: Sufficiently Active (03/01/2023)   Exercise Vital Sign    Days of Exercise per Week: 5 days    Minutes of Exercise per Session: 30 min  Stress: No Stress Concern Present (03/01/2023)   [employer] of Occupational Health - Occupational Stress Questionnaire    Feeling of Stress : Not at all  Social Connections: Socially Integrated (03/01/2023)   Social Connection and Isolation Panel [NHANES]    Frequency of Communication with Friends and Family: More than three times a week    Frequency of Social Gatherings with Friends and Family: More than three times a week    Attends Religious Services: More than 4 times per year    Active Member of [employer] or Organizations: Yes    Attends Banker Meetings: More than 4 times per year    Marital  Status: Married  Catering Manager Violence: Not At Risk (03/01/2023)   Humiliation, Afraid, Rape, and Kick questionnaire    Fear of Current or Ex-Partner: No    Emotionally Abused: No    Physically Abused: No    Sexually Abused: No   Past Surgical History:  Procedure Laterality Date   CYSTECTOMY     Family History  Problem Relation Age of Onset   Cancer Mother        Brain    Arthritis Father    Diabetes Father    Hypertension Sister    Hypertension Sister     Current Outpatient Medications:    Cholecalciferol (VITAMIN D3) 25 MCG (1000 UT) CAPS, Take by mouth., Disp: , Rfl:     hydrochlorothiazide  (HYDRODIURIL ) 25 MG tablet, TAKE 1 TABLET BY MOUTH ONCE DAILY FOR BLOOD PRESSURE, Disp: 90 tablet, Rfl: 3   lisinopril  (ZESTRIL ) 40 MG tablet, Take 1 tablet (40 mg total) by mouth daily., Disp: 90 tablet, Rfl: 3  No Known Allergies   ROS: Review of Systems Pertinent items noted in HPI and remainder of comprehensive ROS otherwise negative.    Physical exam BP 131/75   Pulse 88   Temp 98.7 F (37.1 C)   Ht 5' 5 (1.651 m)   Wt 215 lb 3.2 oz (97.6 kg)   SpO2 98%   BMI 35.81 kg/m  General appearance: alert, cooperative, appears stated age, no distress, and morbidly obese Head: Normocephalic, without obvious abnormality, atraumatic Eyes: negative findings: lids and lashes normal, conjunctivae and sclerae normal, corneas clear, and pupils equal, round, reactive to light and accomodation Ears: normal TM's and external ear canals both ears Nose: Nares normal. Septum midline. Mucosa normal. No drainage or sinus tenderness. Throat: lips, mucosa, and tongue normal; teeth and gums normal Neck: no adenopathy, supple, symmetrical, trachea midline, and thyroid not enlarged, symmetric, no tenderness/mass/nodules Back: symmetric, no curvature. ROM normal. No CVA tenderness. Lungs: clear to auscultation bilaterally Heart: regular rate and rhythm, S1, S2 normal, no murmur, click, rub or gallop Abdomen: soft, non-tender; bowel sounds normal; no masses,  no organomegaly Extremities: extremities normal, atraumatic, no cyanosis or edema Pulses: 2+ and symmetric Skin:  Pigmented nevi noted along the helix of the left ear.  This is flat and unchanged from previous checkup Lymph nodes: Cervical, supraclavicular, and axillary nodes normal. Neurologic: Alert and oriented X 3, normal strength and tone. Normal symmetric reflexes. Normal coordination and gait      04/07/2023    3:36 PM 03/01/2023   11:28 AM 06/29/2022   10:28 AM  Depression screen PHQ 2/9  Decreased Interest 0 0 0  Down,  Depressed, Hopeless 0 0 0  PHQ - 2 Score 0 0 0  Altered sleeping 0  0  Tired, decreased energy 0  0  Change in appetite 0  2  Feeling bad or failure about yourself  0  0  Trouble concentrating 0  0  Moving slowly or fidgety/restless 0  0  Suicidal thoughts 0  0  PHQ-9 Score 0  2  Difficult doing work/chores   Not difficult at all      04/07/2023    3:36 PM 06/29/2022   10:28 AM 02/23/2022    2:52 PM 07/28/2021    1:08 PM  GAD 7 : Generalized Anxiety Score  Nervous, Anxious, on Edge 0 0 0 0  Control/stop worrying 0 0 0 0  Worry too much - different things 0 0 0 0  Trouble relaxing 0 0  0 0  Restless 0 0 0 0  Easily annoyed or irritable 0 0 0 0  Afraid - awful might happen 0 0 0 0  Total GAD 7 Score 0 0 0 0  Anxiety Difficulty  Not difficult at all Not difficult at all Not difficult at all     Assessment/ Plan: [name] here for annual physical exam.   Annual physical exam  Benign hypertension with CKD (chronic kidney disease) stage III (HCC) - Plan: CBC with Differential, CMP14+EGFR, hydrochlorothiazide  (HYDRODIURIL ) 25 MG tablet, lisinopril  (ZESTRIL ) 40 MG tablet, VITAMIN D  25 Hydroxy (Vit-D Deficiency, Fractures)  Morbid obesity (HCC) - Plan: Lipid Panel, TSH, Bayer DCA Hb A1c Waived  UTD vaccines, mammogram done.  Check renal function, CBC, vit d. Continue Ace inhibitor. Refills sent.  BP well controlled.   Continue diet modification.  Agree with limiting simple carbohydrates  Counseled on healthy lifestyle choices, including diet (rich in fruits, vegetables and lean meats and low in salt and simple carbohydrates) and exercise (at least 30 minutes of moderate physical activity daily).  Patient to follow up 1 year for CPE  Marlin Jarrard M. Jolinda, DO

## 2023-11-14 LAB — CMP14+EGFR
ALT: 18 [IU]/L (ref 0–32)
AST: 21 [IU]/L (ref 0–40)
Albumin: 4.5 g/dL (ref 3.8–4.8)
Alkaline Phosphatase: 61 [IU]/L (ref 44–121)
BUN/Creatinine Ratio: 18 (ref 12–28)
BUN: 22 mg/dL (ref 8–27)
Bilirubin Total: 0.9 mg/dL (ref 0.0–1.2)
CO2: 23 mmol/L (ref 20–29)
Calcium: 9.8 mg/dL (ref 8.7–10.3)
Chloride: 100 mmol/L (ref 96–106)
Creatinine, Ser: 1.22 mg/dL — ABNORMAL HIGH (ref 0.57–1.00)
Globulin, Total: 2.6 g/dL (ref 1.5–4.5)
Glucose: 93 mg/dL (ref 70–99)
Potassium: 4.3 mmol/L (ref 3.5–5.2)
Sodium: 139 mmol/L (ref 134–144)
Total Protein: 7.1 g/dL (ref 6.0–8.5)
eGFR: 47 mL/min/{1.73_m2} — ABNORMAL LOW (ref >59)

## 2023-11-14 LAB — CBC WITH DIFFERENTIAL/PLATELET
Basophils Absolute: 0 10*3/uL (ref 0.0–0.2)
Basos: 0 %
EOS (ABSOLUTE): 0.4 10*3/uL (ref 0.0–0.4)
Eos: 6 %
Hematocrit: 42.3 % (ref 34.0–46.6)
Hemoglobin: 13.8 g/dL (ref 11.1–15.9)
Immature Grans (Abs): 0 10*3/uL (ref 0.0–0.1)
Immature Granulocytes: 0 %
Lymphocytes Absolute: 1.6 10*3/uL (ref 0.7–3.1)
Lymphs: 21 %
MCH: 29.4 pg (ref 26.6–33.0)
MCHC: 32.6 g/dL (ref 31.5–35.7)
MCV: 90 fL (ref 79–97)
Monocytes Absolute: 0.6 10*3/uL (ref 0.1–0.9)
Monocytes: 7 %
Neutrophils Absolute: 5 10*3/uL (ref 1.4–7.0)
Neutrophils: 66 %
Platelets: 292 10*3/uL (ref 150–450)
RBC: 4.69 x10E6/uL (ref 3.77–5.28)
RDW: 11.9 % (ref 11.7–15.4)
WBC: 7.6 10*3/uL (ref 3.4–10.8)

## 2023-11-14 LAB — LIPID PANEL
Chol/HDL Ratio: 3.3 {ratio} (ref 0.0–4.4)
Cholesterol, Total: 165 mg/dL (ref 100–199)
HDL: 50 mg/dL (ref >39)
LDL Chol Calc (NIH): 95 mg/dL (ref 0–99)
Triglycerides: 110 mg/dL (ref 0–149)
VLDL Cholesterol Cal: 20 mg/dL (ref 5–40)

## 2023-11-14 LAB — TSH: TSH: 3.65 u[IU]/mL (ref 0.450–4.500)

## 2023-11-14 LAB — VITAMIN D 25 HYDROXY (VIT D DEFICIENCY, FRACTURES): Vit D, 25-Hydroxy: 41.5 ng/mL (ref 30.0–100.0)

## 2024-03-04 ENCOUNTER — Other Ambulatory Visit: Payer: Self-pay | Admitting: Family Medicine

## 2024-03-04 DIAGNOSIS — N183 Chronic kidney disease, stage 3 unspecified: Secondary | ICD-10-CM

## 2024-03-04 NOTE — Telephone Encounter (Signed)
 Copied from CRM [id number]. Topic: Clinical - Medication Refill >> Mar 04, 2024  8:10 AM Madelyne Schiff wrote: Most Recent Primary Care Visit:   Medication: hydrochlorothiazide  (HYDRODIURIL ) 25 MG tablet (accidentally thru them away)   Has the patient contacted their pharmacy? Yes (Agent: If no, request that the patient contact the pharmacy for the refill. If patient does not wish to contact the pharmacy document the reason why and proceed with request.) (Agent: If yes, when and what did the pharmacy advise?)  Is this the correct pharmacy for this prescription? Yes If no, delete pharmacy and type the correct one.  This is the patient's preferred pharmacy:  [id number] - [place], [place] - [address] [place] [address] Phone: [phone] Fax: [phone]   Has the prescription been filled recently? Yes  Is the patient out of the medication? Yes  Has the patient been seen for an appointment in the last year OR does the patient have an upcoming appointment? Yes  Can we respond through MyChart? No  Agent: Please be advised that Rx refills may take up to 3 business days. We ask that you follow-up with your pharmacy.

## 2024-03-04 NOTE — Telephone Encounter (Signed)
 Pt reports she accidentally threw away her hydrochlorothiazide  25 mg. RN called pt and advised her the med was prescribed with 3 refills and asked that she contact her pharmacy. Pt verbalized understanding.

## 2024-03-28 ENCOUNTER — Encounter

## 2024-03-28 NOTE — Progress Notes (Unsigned)
 Subjective:   Maria Hart is a 73 y.o. who presents for a Medicare Wellness preventive visit.  As a reminder, Annual Wellness Visits don't include a physical exam, and some assessments may be limited, especially if this visit is performed virtually. We may recommend an in-person follow-up visit with your provider if needed.  Visit Complete: {[phone]}  {AWVVIDEO:32072}  Persons Participating in Visit: {Persons Participating in Visit:32444}  AWV Questionnaire: {AWVQuestionnaire:32338}        Objective:     There were no vitals filed for this visit. There is no height or weight on file to calculate BMI.     03/01/2023   11:32 AM 02/25/2022   11:33 AM 02/24/2021    1:46 PM 02/24/2020    1:41 PM  Advanced Directives  Does Patient Have a Medical Advance Directive? Yes Yes No Yes  Type of Advance Directive Living will;Healthcare Power of [employer] Power of [place];Living will  Living will  Does patient want to make changes to medical advance directive? No - Patient declined     Copy of Healthcare Power of Attorney in Chart? No - copy requested No - copy requested    Would patient like information on creating a medical advance directive?   Yes (MAU/Ambulatory/Procedural Areas - Information given)     Current Medications (verified) Outpatient Encounter Medications as of 03/28/2024  Medication Sig   Cholecalciferol (VITAMIN D3) 25 MCG (1000 UT) CAPS Take by mouth.   hydrochlorothiazide  (HYDRODIURIL ) 25 MG tablet TAKE 1 TABLET BY MOUTH ONCE DAILY FOR BLOOD PRESSURE   lisinopril  (ZESTRIL ) 40 MG tablet Take 1 tablet (40 mg total) by mouth daily.   No facility-administered encounter medications on file as of 03/28/2024.    Allergies (verified) Patient has no known allergies.   History: Past Medical History:  Diagnosis Date   Arthritis    Hypertension    Past Surgical History:  Procedure Laterality Date   CYSTECTOMY     Family History  Problem  Relation Age of Onset   Cancer Mother        Brain    Arthritis Father    Diabetes Father    Hypertension Sister    Hypertension Sister    Social History   Socioeconomic History   Marital status: Married    Spouse name: Training and development officer   Number of children: 1   Years of education: Not on file   Highest education level: High school graduate  Occupational History   Occupation: Retired  Tobacco Use   Smoking status: Never   Smokeless tobacco: Never  Vaping Use   Vaping status: Never Used  Substance and Sexual Activity   Alcohol use: Never   Drug use: Never   Sexual activity: Not Currently  Other Topics Concern   Not on file  Social History Narrative   Lives with her husband - daughter lives nearby   Social Drivers of Health   Financial Resource Strain: Low Risk  (03/01/2023)   Overall Financial Resource Strain (CARDIA)    Difficulty of Paying Living Expenses: Not hard at all  Food Insecurity: No Food Insecurity (03/01/2023)   Hunger Vital Sign    Worried About Running Out of Food in the Last Year: Never true    Ran Out of Food in the Last Year: Never true  Transportation Needs: No Transportation Needs (03/01/2023)   PRAPARE - Administrator, Civil Service (Medical): No    Lack of Transportation (Non-Medical): No  Physical Activity: Sufficiently Active (  03/01/2023)   Exercise Vital Sign    Days of Exercise per Week: 5 days    Minutes of Exercise per Session: 30 min  Stress: No Stress Concern Present (03/01/2023)   [employer] of Occupational Health - Occupational Stress Questionnaire    Feeling of Stress : Not at all  Social Connections: Socially Integrated (03/01/2023)   Social Connection and Isolation Panel [NHANES]    Frequency of Communication with Friends and Family: More than three times a week    Frequency of Social Gatherings with Friends and Family: More than three times a week    Attends Religious Services: More than 4 times per year    Active Member of  Clubs or Organizations: Yes    Attends Banker Meetings: More than 4 times per year    Marital Status: Married    Tobacco Counseling Counseling given: Not Answered    Clinical Intake:              Lab Results  Component Value Date   HGBA1C 5.4 11/13/2023   HGBA1C 5.6 06/29/2022   HGBA1C 5.6 04/12/2021               Activities of Daily Living ***     No data to display          Patient Care Team: Eliodoro Guerin, DO as PCP - General (Family Medicine) Alexia Idler, OD (Optometry)  Indicate any recent Medical Services you may have received from other than [facility] providers in the past year (date may be approximate).     Assessment:    This is a routine wellness examination for Louanna.  Hearing/Vision screen No results found.   Goals Addressed   None    Depression Screen ***    04/07/2023    3:36 PM 03/01/2023   11:28 AM 06/29/2022   10:28 AM 02/25/2022   11:20 AM 02/23/2022    2:52 PM 07/28/2021    1:08 PM 04/12/2021    9:29 AM  PHQ 2/9 Scores  PHQ - 2 Score 0 0 0 0 0 0 0  PHQ- 9 Score 0  2    2    Fall Risk ***    04/07/2023    3:35 PM 03/01/2023   11:32 AM 06/29/2022   10:28 AM 02/25/2022   11:19 AM 02/23/2022    2:52 PM  Fall Risk   Falls in the past year? 0 0 0 1 1  Number falls in past yr: 0 0  0 1  Injury with Fall? 0 0  0 0  Risk for fall due to : No Fall Risks No Fall Risks  History of fall(s);Orthopedic patient History of fall(s)  Follow up Education provided Falls prevention discussed;Education provided;Falls evaluation completed  Education provided;Falls prevention discussed Education provided    MEDICARE RISK AT HOME: ***    TIMED UP AND GO:  Was the test performed?  {AMBTIMEDUPGO:[phone]}  Cognitive Function: {CognitiveScreening:32337}        03/01/2023   11:32 AM 02/25/2022   11:22 AM 02/24/2020    1:44 PM  6CIT Screen  What Year? 0 points 0 points 0 points  What month? 0 points 0 points 0 points   What time? 0 points 0 points 0 points  Count back from 20 0 points 0 points 0 points  Months in reverse 0 points 0 points 0 points  Repeat phrase 0 points 6 points 2 points  Total Score 0 points 6 points 2  points    Immunizations Immunization History  Administered Date(s) Administered   Fluad Quad(high Dose 65+) 08/13/2019, 09/11/2020, 07/28/2021, 10/20/2023   Moderna Sars-Covid-2 Vaccination 12/14/2019, 01/11/2020, 08/24/2020   PNEUMOCOCCAL CONJUGATE-20 02/23/2022   Zoster Recombinant(Shingrix ) 02/23/2022, 06/29/2022    Screening Tests Health Maintenance  Topic Date Due   COVID-19 Vaccine (4 - 2024-25 season) 07/02/2023   Medicare Annual Wellness (AWV)  02/29/2024   DTaP/Tdap/Td (1 - Tdap) 11/12/2024 (Originally 07/18/1970)   INFLUENZA VACCINE  05/31/2024   MAMMOGRAM  10/02/2024   Fecal DNA (Cologuard)  03/11/2025   DEXA SCAN  08/04/2025   Pneumonia Vaccine 53+ Years old  Completed   Hepatitis C Screening  Completed   Zoster Vaccines- Shingrix   Completed   HPV VACCINES  Aged Out   Meningococcal B Vaccine  Aged Out    Health Maintenance  Health Maintenance Due  Topic Date Due   COVID-19 Vaccine (4 - 2024-25 season) 07/02/2023   Medicare Annual Wellness (AWV)  02/29/2024   Health Maintenance Items Addressed: {HMMCR (Optional):30011}  Additional Screening:  Vision Screening: Recommended annual ophthalmology exams for early detection of glaucoma and other disorders of the eye.  Dental Screening: Recommended annual dental exams for proper oral hygiene  Community Resource Referral / Chronic Care Management: CRR required this visit?  {YES/NO:21197}  CCM required this visit?  {CCM Required [phone]}   Plan:    I have personally reviewed and noted the following in the patient's chart:   Medical and social history Use of alcohol, tobacco or illicit drugs  Current medications and supplements including opioid prescriptions. {Opioid  Prescriptions:[phone]} Functional ability and status Nutritional status Physical activity Advanced directives List of other physicians Hospitalizations, surgeries, and ER visits in previous 12 months Vitals Screenings to include cognitive, depression, and falls Referrals and appointments  In addition, I have reviewed and discussed with patient certain preventive protocols, quality metrics, and best practice recommendations. A written personalized care plan for preventive services as well as general preventive health recommendations were provided to patient.   Michaelle Adolphus, CMA   03/28/2024   After Visit Summary: {CHL AMB AWV After Visit Summary:[phone]}  Notes: {Nurse Notes:32343}

## 2024-05-27 ENCOUNTER — Ambulatory Visit (INDEPENDENT_AMBULATORY_CARE_PROVIDER_SITE_OTHER)

## 2024-05-27 VITALS — BP 131/75 | HR 88 | Ht 65.0 in | Wt 215.0 lb

## 2024-05-27 DIAGNOSIS — Z0000 Encounter for general adult medical examination without abnormal findings: Secondary | ICD-10-CM | POA: Diagnosis not present

## 2024-05-27 NOTE — Progress Notes (Signed)
 Subjective:   Maria Hart is a 73 y.o. who presents for a Medicare Wellness preventive visit.  As a reminder, Annual Wellness Visits don't include a physical exam, and some assessments may be limited, especially if this visit is performed virtually. We may recommend an in-person follow-up visit with your provider if needed.  Visit Complete: Virtual I connected with  Maria Hart on 05/27/24 by a audio enabled telemedicine application and verified that I am speaking with the correct person using two identifiers.  Patient Location: Home  Provider Location: Home Office  I discussed the limitations of evaluation and management by telemedicine. The patient expressed understanding and agreed to proceed.  Vital Signs: Because this visit was a virtual/telehealth visit, some criteria may be missing or patient reported. Any vitals not documented were not able to be obtained and vitals that have been documented are patient reported.  VideoDeclined- This patient declined Librarian, academic. Therefore the visit was completed with audio only.  Persons Participating in Visit: Patient.  AWV Questionnaire: No: Patient Medicare AWV questionnaire was not completed prior to this visit.  Cardiac Risk Factors include: advanced age (>6men, >19 women);hypertension;obesity (BMI >30kg/m2)     Objective:    Today's Vitals   05/27/24 0843  BP: 131/75  Pulse: 88  Weight: 215 lb (97.5 kg)  Height: 5' 5 (1.651 m)   Body mass index is 35.78 kg/m.     05/27/2024    8:47 AM 03/01/2023   11:32 AM 02/25/2022   11:33 AM 02/24/2021    1:46 PM 02/24/2020    1:41 PM  Advanced Directives  Does Patient Have a Medical Advance Directive? No Yes Yes No Yes  Type of Advance Directive  Living will;Healthcare Power of [employer] Power of [place];Living will  Living will  Does patient want to make changes to medical advance directive?  No - Patient declined     Copy of  Healthcare Power of Attorney in Chart?  No - copy requested No - copy requested    Would patient like information on creating a medical advance directive?    Yes (MAU/Ambulatory/Procedural Areas - Information given)     Current Medications (verified) Outpatient Encounter Medications as of 05/27/2024  Medication Sig   Cholecalciferol (VITAMIN D3) 25 MCG (1000 UT) CAPS Take by mouth.   hydrochlorothiazide  (HYDRODIURIL ) 25 MG tablet TAKE 1 TABLET BY MOUTH ONCE DAILY FOR BLOOD PRESSURE   lisinopril  (ZESTRIL ) 40 MG tablet Take 1 tablet (40 mg total) by mouth daily.   No facility-administered encounter medications on file as of 05/27/2024.    Allergies (verified) Patient has no known allergies.   History: Past Medical History:  Diagnosis Date   Arthritis    Hypertension    Past Surgical History:  Procedure Laterality Date   CYSTECTOMY     Family History  Problem Relation Age of Onset   Cancer Mother        Brain    Arthritis Father    Diabetes Father    Hypertension Sister    Hypertension Sister    Social History   Socioeconomic History   Marital status: Married    Spouse name: Maria Hart   Number of children: 1   Years of education: Not on file   Highest education level: High school graduate  Occupational History   Occupation: Retired  Tobacco Use   Smoking status: Never   Smokeless tobacco: Never  Vaping Use   Vaping status: Never Used  Substance  and Sexual Activity   Alcohol use: Never   Drug use: Never   Sexual activity: Not Currently  Other Topics Concern   Not on file  Social History Narrative   Lives with her husband - daughter lives nearby   Social Drivers of Health   Financial Resource Strain: Low Risk  (05/27/2024)   Overall Financial Resource Strain (CARDIA)    Difficulty of Paying Living Expenses: Not hard at all  Food Insecurity: No Food Insecurity (05/27/2024)   Hunger Vital Sign    Worried About Running Out of Food in the Last Year: Never true     Ran Out of Food in the Last Year: Never true  Transportation Needs: No Transportation Needs (05/27/2024)   PRAPARE - Administrator, Civil Service (Medical): No    Lack of Transportation (Non-Medical): No  Physical Activity: Sufficiently Active (05/27/2024)   Exercise Vital Sign    Days of Exercise per Week: 5 days    Minutes of Exercise per Session: 30 min  Stress: No Stress Concern Present (05/27/2024)   [employer] of Occupational Health - Occupational Stress Questionnaire    Feeling of Stress: Not at all  Social Connections: Socially Integrated (05/27/2024)   Social Connection and Isolation Panel    Frequency of Communication with Friends and Family: More than three times a week    Frequency of Social Gatherings with Friends and Family: More than three times a week    Attends Religious Services: More than 4 times per year    Active Member of [employer] or Organizations: Yes    Attends Banker Meetings: Never    Marital Status: Married    Tobacco Counseling Counseling given: Yes    Clinical Intake:  Pre-visit preparation completed: Yes  Pain : No/denies pain     BMI - recorded: 35.78 Nutritional Status: BMI > 30  Obese Nutritional Risks: None Diabetes: No  Lab Results  Component Value Date   HGBA1C 5.4 11/13/2023   HGBA1C 5.6 06/29/2022   HGBA1C 5.6 04/12/2021     How often do you need to have someone help you when you read instructions, pamphlets, or other written materials from your doctor or pharmacy?: 1 - Never  Interpreter Needed?: No  Information entered by :: alia t/cma   Activities of Daily Living     05/27/2024    8:45 AM  In your present state of health, do you have any difficulty performing the following activities:  Hearing? 0  Vision? 0  Difficulty concentrating or making decisions? 0  Walking or climbing stairs? 0  Dressing or bathing? 0  Doing errands, shopping? 0  Preparing Food and eating ? N  Using the Toilet?  N  In the past six months, have you accidently leaked urine? N  Do you have problems with loss of bowel control? N  Managing your Medications? N  Managing your Finances? N  Housekeeping or managing your Housekeeping? N    Patient Care Team: Jolinda Norene HERO, DO as PCP - General (Family Medicine) Vicci Mcardle, OD (Optometry)  I have updated your Care Teams any recent Medical Services you may have received from other providers in the past year.     Assessment:   This is a routine wellness examination for Ayen.  Hearing/Vision screen Hearing Screening - Comments:: Pt denies hearing dif Vision Screening - Comments:: pt wear glasses/pt goes to [facility] [address] in [place]/last ov 2024   Goals Addressed   None  Depression Screen     05/27/2024    8:56 AM 04/07/2023    3:36 PM 03/01/2023   11:28 AM 06/29/2022   10:28 AM 02/25/2022   11:20 AM 02/23/2022    2:52 PM 07/28/2021    1:08 PM  PHQ 2/9 Scores  PHQ - 2 Score 0 0 0 0 0 0 0  PHQ- 9 Score  0  2       Fall Risk     05/27/2024    8:45 AM 04/07/2023    3:35 PM 03/01/2023   11:32 AM 06/29/2022   10:28 AM 02/25/2022   11:19 AM  Fall Risk   Falls in the past year? 0 0 0 0 1  Number falls in past yr: 0 0 0  0  Injury with Fall? 0 0 0  0  Risk for fall due to : No Fall Risks No Fall Risks No Fall Risks  History of fall(s);Orthopedic patient  Follow up Falls evaluation completed Education provided Falls prevention discussed;Education provided;Falls evaluation completed  Education provided;Falls prevention discussed      Data saved with a previous flowsheet row definition    MEDICARE RISK AT HOME:  Medicare Risk at Home Any stairs in or around the home?: Yes If so, are there any without handrails?: Yes Home free of loose throw rugs in walkways, pet beds, electrical cords, etc?: Yes Adequate lighting in your home to reduce risk of falls?: Yes Life alert?: No Use of a cane, walker or w/c?: No Grab bars in the bathroom?:  Yes Shower chair or bench in shower?: Yes Elevated toilet seat or a handicapped toilet?: Yes  TIMED UP AND GO:  Was the test performed?  no  Cognitive Function: 6CIT completed        05/27/2024    8:47 AM 03/01/2023   11:32 AM 02/25/2022   11:22 AM 02/24/2020    1:44 PM  6CIT Screen  What Year? 0 points 0 points 0 points 0 points  What month? 0 points 0 points 0 points 0 points  What time? 0 points 0 points 0 points 0 points  Count back from 20 0 points 0 points 0 points 0 points  Months in reverse 0 points 0 points 0 points 0 points  Repeat phrase 0 points 0 points 6 points 2 points  Total Score 0 points 0 points 6 points 2 points    Immunizations Immunization History  Administered Date(s) Administered   Fluad Quad(high Dose 65+) 08/13/2019, 09/11/2020, 07/28/2021, 10/20/2023   Moderna Sars-Covid-2 Vaccination 12/14/2019, 01/11/2020, 08/24/2020   PNEUMOCOCCAL CONJUGATE-20 02/23/2022   Zoster Recombinant(Shingrix ) 02/23/2022, 06/29/2022    Screening Tests Health Maintenance  Topic Date Due   COVID-19 Vaccine (4 - 2024-25 season) 07/02/2023   DTaP/Tdap/Td (1 - Tdap) 11/12/2024 (Originally 07/18/1970)   INFLUENZA VACCINE  05/31/2024   MAMMOGRAM  10/02/2024   Fecal DNA (Cologuard)  03/11/2025   Medicare Annual Wellness (AWV)  05/27/2025   DEXA SCAN  08/04/2025   Pneumococcal Vaccine: 50+ Years  Completed   Hepatitis C Screening  Completed   Zoster Vaccines- Shingrix   Completed   Hepatitis B Vaccines  Aged Out   HPV VACCINES  Aged Out   Meningococcal B Vaccine  Aged Out    Health Maintenance  Health Maintenance Due  Topic Date Due   COVID-19 Vaccine (4 - 2024-25 season) 07/02/2023   Health Maintenance Items Addressed: See Nurse Notes at the end of this note  Additional Screening:  Vision Screening: Recommended  annual ophthalmology exams for early detection of glaucoma and other disorders of the eye. Would you like a referral to an eye doctor? No    Dental  Screening: Recommended annual dental exams for proper oral hygiene  Community Resource Referral / Chronic Care Management: CRR required this visit?  No   CCM required this visit?  No   Plan:    I have personally reviewed and noted the following in the patient's chart:   Medical and social history Use of alcohol, tobacco or illicit drugs  Current medications and supplements including opioid prescriptions. Patient is not currently taking opioid prescriptions. Functional ability and status Nutritional status Physical activity Advanced directives List of other physicians Hospitalizations, surgeries, and ER visits in previous 12 months Vitals Screenings to include cognitive, depression, and falls Referrals and appointments  In addition, I have reviewed and discussed with patient certain preventive protocols, quality metrics, and best practice recommendations. A written personalized care plan for preventive services as well as general preventive health recommendations were provided to patient.   Ozie Ned, CMA   05/27/2024   After Visit Summary: (MyChart) Due to this being a telephonic visit, the after visit summary with patients personalized plan was offered to patient via MyChart   Notes: Nothing significant to report at this time.

## 2024-05-27 NOTE — Patient Instructions (Signed)
 Maria Hart , Thank you for taking time out of your busy schedule to complete your Annual Wellness Visit with me. I enjoyed our conversation and look forward to speaking with you again next year. I, as well as your care team,  appreciate your ongoing commitment to your health goals. Please review the following plan we discussed and let me know if I can assist you in the future. Your Game plan/ To Do List    Follow up Visits: Next Medicare AWV with our clinical staff: 05/28/25 at 8:40a.m.   Next Office Visit with your provider: 11/13/24 at 8:00a.m.  Clinician Recommendations:  Aim for 30 minutes of exercise or brisk walking, 6-8 glasses of water, and 5 servings of fruits and vegetables each day.       This is a list of the screening recommended for you and due dates:  Health Maintenance  Topic Date Due   COVID-19 Vaccine (4 - 2024-25 season) 07/02/2023   Medicare Annual Wellness Visit  02/29/2024   DTaP/Tdap/Td vaccine (1 - Tdap) 11/12/2024*   Flu Shot  05/31/2024   Mammogram  10/02/2024   Cologuard (Stool DNA test)  03/11/2025   DEXA scan (bone density measurement)  08/04/2025   Pneumococcal Vaccine for age over 63  Completed   Hepatitis C Screening  Completed   Zoster (Shingles) Vaccine  Completed   Hepatitis B Vaccine  Aged Out   HPV Vaccine  Aged Out   Meningitis B Vaccine  Aged Out  *Topic was postponed. The date shown is not the original due date.    Advanced directives: (Declined) Advance directive discussed with you today. Even though you declined this today, please call our office should you change your mind, and we can give you the proper paperwork for you to fill out. Advance Care Planning is important because it:  [x]  Makes sure you receive the medical care that is consistent with your values, goals, and preferences  [x]  It provides guidance to your family and loved ones and reduces their decisional burden about whether or not they are making the right decisions based on  your wishes.  Follow the link provided in your after visit summary or read over the paperwork we have mailed to you to help you started getting your Advance Directives in place. If you need assistance in completing these, please reach out to us  so that we can help you!  See attachments for Preventive Care and Fall Prevention Tips.

## 2024-06-21 ENCOUNTER — Encounter: Payer: Self-pay | Admitting: Radiology

## 2024-09-02 ENCOUNTER — Encounter: Payer: Self-pay | Admitting: Radiology

## 2024-10-03 LAB — HM MAMMOGRAPHY

## 2024-10-04 ENCOUNTER — Encounter: Payer: Self-pay | Admitting: Family Medicine

## 2024-11-13 ENCOUNTER — Ambulatory Visit

## 2024-11-13 ENCOUNTER — Encounter: Payer: Medicare HMO | Admitting: Family Medicine

## 2024-11-21 ENCOUNTER — Other Ambulatory Visit: Payer: Self-pay | Admitting: Family Medicine

## 2024-11-21 DIAGNOSIS — N183 Chronic kidney disease, stage 3 unspecified: Secondary | ICD-10-CM

## 2024-11-22 ENCOUNTER — Ambulatory Visit (INDEPENDENT_AMBULATORY_CARE_PROVIDER_SITE_OTHER): Admitting: Family Medicine

## 2024-11-22 ENCOUNTER — Encounter: Payer: Self-pay | Admitting: Family Medicine

## 2024-11-22 VITALS — BP 129/71 | HR 66 | Temp 97.5°F | Ht 65.0 in | Wt 209.5 lb

## 2024-11-22 DIAGNOSIS — I129 Hypertensive chronic kidney disease with stage 1 through stage 4 chronic kidney disease, or unspecified chronic kidney disease: Secondary | ICD-10-CM

## 2024-11-22 DIAGNOSIS — N183 Chronic kidney disease, stage 3 unspecified: Secondary | ICD-10-CM

## 2024-11-22 DIAGNOSIS — Z0000 Encounter for general adult medical examination without abnormal findings: Secondary | ICD-10-CM

## 2024-11-22 DIAGNOSIS — Z23 Encounter for immunization: Secondary | ICD-10-CM | POA: Diagnosis not present

## 2024-11-22 DIAGNOSIS — M545 Low back pain, unspecified: Secondary | ICD-10-CM | POA: Diagnosis not present

## 2024-11-22 LAB — URINALYSIS, ROUTINE W REFLEX MICROSCOPIC
Bilirubin, UA: NEGATIVE
Glucose, UA: NEGATIVE
Ketones, UA: NEGATIVE
Leukocytes,UA: NEGATIVE
Nitrite, UA: NEGATIVE
Protein,UA: NEGATIVE
RBC, UA: NEGATIVE
Specific Gravity, UA: 1.015 (ref 1.005–1.030)
Urobilinogen, Ur: 0.2 mg/dL (ref 0.2–1.0)
pH, UA: 7 (ref 5.0–7.5)

## 2024-11-22 LAB — BAYER DCA HB A1C WAIVED: HB A1C (BAYER DCA - WAIVED): 5 % (ref 4.8–5.6)

## 2024-11-22 MED ORDER — EMPAGLIFLOZIN 10 MG PO TABS: 10.0000 mg | ORAL_TABLET | Freq: Every day | ORAL | 3 refills | Status: AC

## 2024-11-22 MED ORDER — LISINOPRIL 40 MG PO TABS: 40.0000 mg | ORAL_TABLET | Freq: Every day | ORAL | 3 refills | Status: AC

## 2024-11-22 MED ORDER — HYDROCHLOROTHIAZIDE 25 MG PO TABS: 25.0000 mg | ORAL_TABLET | Freq: Every day | ORAL | 3 refills | Status: AC

## 2024-11-22 NOTE — Progress Notes (Signed)
 "  Maria Hart is a 74 y.o. female presents to office today for annual physical exam examination.    She reports that she is doing really well.  Voices no concerns today.  She does report that she had a flareup of her osteoarthritis on the right knee which is known to be bone-on-bone.  She was seen in the ER and had a prednisone pack given which did help.  She rarely uses any NSAIDs because of the kidney disease she has but sometimes does find that is the most helpful compared to everything else.  She was wondering if turmeric would be something okay for her to start.  Utilizing brace.  She was actually prescribed a fancier brace but notes it was over $800 so she just got something from her local pharmacy.  She is due to have surgery on this knee but is really trying to avoid that as much as possible because of the help of her husband, who is also recovering from an orthopedic surgery.  She does go on to comment that she has had some left low back pain that started about a week ago.  No preceding injury.  Describes it as dull and achy and utilizing some topical patches for it.  She reports no dysuria, hematuria, fevers, nausea or vomiting.   Occupation: Retired, Marital status: Married, Substance use: None Health Maintenance Due  Topic Date Due   DTaP/Tdap/Td (1 - Tdap) Never done   Bone Density Scan  08/04/2022   Influenza Vaccine  05/31/2024   COVID-19 Vaccine (4 - 2025-26 season) 07/01/2024    Immunization History  Administered Date(s) Administered   Fluad Quad(high Dose 65+) 08/13/2019, 09/11/2020, 07/28/2021, 10/20/2023   Moderna Sars-Covid-2 Vaccination 12/14/2019, 01/11/2020, 08/24/2020   PNEUMOCOCCAL CONJUGATE-20 02/23/2022   Zoster Recombinant(Shingrix ) 02/23/2022, 06/29/2022   Past Medical History:  Diagnosis Date   Arthritis    Hypertension    Social History   Socioeconomic History   Marital status: Married    Spouse name: Training And Development Officer   Number of children: 1   Years of  education: Not on file   Highest education level: 12th grade  Occupational History   Occupation: Retired  Tobacco Use   Smoking status: Never   Smokeless tobacco: Never  Vaping Use   Vaping status: Never Used  Substance and Sexual Activity   Alcohol use: Never   Drug use: Never   Sexual activity: Not Currently  Other Topics Concern   Not on file  Social History Narrative   Lives with her husband - daughter lives nearby   Social Drivers of Health   Tobacco Use: Low Risk (10/02/2024)   Received from [facility]   Patient History    Smoking Tobacco Use: Never    Smokeless Tobacco Use: Never    Passive Exposure: Not on file  Financial Resource Strain: Low Risk (11/21/2024)   Overall Financial Resource Strain (CARDIA)    Difficulty of Paying Living Expenses: Not hard at all  Food Insecurity: No Food Insecurity (11/21/2024)   Epic    Worried About Radiation Protection Practitioner of Food in the Last Year: Never true    Ran Out of Food in the Last Year: Never true  Transportation Needs: No Transportation Needs (11/21/2024)   Epic    Lack of Transportation (Medical): No    Lack of Transportation (Non-Medical): No  Physical Activity: Insufficiently Active (11/21/2024)   Exercise Vital Sign    Days of Exercise per Week: 1 day  Minutes of Exercise per Session: 10 min  Stress: No Stress Concern Present (11/21/2024)   [employer] of Occupational Health - Occupational Stress Questionnaire    Feeling of Stress: Not at all  Social Connections: Socially Integrated (11/21/2024)   Social Connection and Isolation Panel    Frequency of Communication with Friends and Family: More than three times a week    Frequency of Social Gatherings with Friends and Family: Once a week    Attends Religious Services: More than 4 times per year    Active Member of Clubs or Organizations: Yes    Attends Banker Meetings: More than 4 times per year    Marital Status: Married  Catering Manager Violence: Not  At Risk (05/27/2024)   Epic    Fear of Current or Ex-Partner: No    Emotionally Abused: No    Physically Abused: No    Sexually Abused: No  Depression (PHQ2-9): Low Risk (05/27/2024)   Depression (PHQ2-9)    PHQ-2 Score: 0  Alcohol Screen: Low Risk (05/27/2024)   Alcohol Screen    Last Alcohol Screening Score (AUDIT): 0  Housing: Unknown (11/21/2024)   Epic    Unable to Pay for Housing in the Last Year: No    Number of Times Moved in the Last Year: Not on file    Homeless in the Last Year: No  Utilities: Not At Risk (05/27/2024)   Epic    Threatened with loss of utilities: No  Health Literacy: Adequate Health Literacy (05/27/2024)   B1300 Health Literacy    Frequency of need for help with medical instructions: Never   Past Surgical History:  Procedure Laterality Date   CYSTECTOMY     Family History  Problem Relation Age of Onset   Cancer Mother        Brain    Arthritis Father    Diabetes Father    Hypertension Sister    Hypertension Sister    Current Medications[1]  Allergies[2]   ROS: Review of Systems Pertinent items noted in HPI and remainder of comprehensive ROS otherwise negative.    Physical exam BP 129/71   Pulse 66   Temp (!) 97.5 F (36.4 C)   Ht 5' 5 (1.651 m)   Wt 209 lb 8 oz (95 kg)   SpO2 99%   BMI 34.86 kg/m  General appearance: alert, cooperative, and appears stated age Head: Normocephalic, without obvious abnormality, atraumatic Eyes: conjunctivae/corneas clear. PERRL, EOM's intact.  Ears: normal TM's and external ear canals both ears Nose: Nares normal. Septum midline. Mucosa normal. No drainage or sinus tenderness. Throat: lips, mucosa, and tongue normal; teeth and gums normal Neck: no adenopathy, no carotid bruit, supple, symmetrical, trachea midline, and thyroid not enlarged, symmetric, no tenderness/mass/nodules Back: symmetric, no curvature. ROM normal. No CVA tenderness. Lungs: clear to auscultation bilaterally Heart: regular rate and  rhythm, S1, S2 normal, no murmur, click, rub or gallop Abdomen: soft, non-tender; bowel sounds normal; no masses,  no organomegaly Extremities: Osteoarthritic changes to bilateral knees appreciated.  Ambulating independently.  Has difficulty getting up on the table but able to do so Pulses: 2+ and symmetric Skin: Skin color, texture, turgor normal. No rashes or lesions Lymph nodes: No supraclavicular or anterior cervical lymphadenopathy Neurologic: Alert and oriented X 3, normal strength and tone. Normal symmetric reflexes. Normal coordination and gait      11/22/2024    8:11 AM 05/27/2024    8:56 AM 04/07/2023    3:36 PM  Depression  screen PHQ 2/9  Decreased Interest 0 0 0  Down, Depressed, Hopeless 0 0 0  PHQ - 2 Score 0 0 0  Altered sleeping 0  0  Tired, decreased energy 0  0  Change in appetite 1  0  Feeling bad or failure about yourself  0  0  Trouble concentrating 0  0  Moving slowly or fidgety/restless 0  0  Suicidal thoughts 0  0  PHQ-9 Score 1  0   Difficult doing work/chores Not difficult at all       Data saved with a previous flowsheet row definition      11/22/2024    8:11 AM 04/07/2023    3:36 PM 06/29/2022   10:28 AM 02/23/2022    2:52 PM  GAD 7 : Generalized Anxiety Score  Nervous, Anxious, on Edge 0 0  0  0   Control/stop worrying 0 0  0  0   Worry too much - different things 0 0  0  0   Trouble relaxing 0 0  0  0   Restless 0 0  0  0   Easily annoyed or irritable 0 0  0  0   Afraid - awful might happen 0 0  0  0   Total GAD 7 Score 0 0 0 0  Anxiety Difficulty Not difficult at all  Not difficult at all Not difficult at all     Data saved with a previous flowsheet row definition    Recent Results (from the past 2160 hours)  HM MAMMOGRAPHY     Status: None   Collection Time: 10/03/24  2:56 PM  Result Value Ref Range   HM Mammogram 0-4 Bi-Rad 0-4 Bi-Rad, Self Reported Normal    Comment: Abstracted by HIM     Assessment/ Plan: Maria Hart here for  annual physical exam.   Annual physical exam  Benign hypertension with CKD (chronic kidney disease) stage III (HCC) - Plan: CBC with Differential, VITAMIN D  25 Hydroxy (Vit-D Deficiency, Fractures), CMP14+EGFR, Microalbumin / creatinine urine ratio, hydrochlorothiazide  (HYDRODIURIL ) 25 MG tablet, lisinopril  (ZESTRIL ) 40 MG tablet, empagliflozin (JARDIANCE) 10 MG TABS tablet, AMB Referral [facility] Care Management, DG [facility] DEXA  Morbid obesity (HCC) - Plan: CMP14+EGFR, Bayer DCA Hb A1c Waived, Lipid Panel  Acute left-sided low back pain without sciatica - Plan: Urinalysis, Routine w reflex microscopic  Immunization due - Plan: Tdap vaccine greater than or equal to 7yo IM  Tetanus shot administered today.  She will schedule bone density  Start Jardiance 10 mg daily.  1 month sample given.  Referral to clinical pharmacy for patient assistance as anticipate she will need this.  We discussed potential side effects of the medication and reasons for discontinuation.  I would like to see her back in 4 to 6 months for recheck and recheck renal function.  Check A1c, lipid panel.  Check UA given low back pain without sciatica to rule out any evidence of stone or infection  Counseled on healthy lifestyle choices, including diet (rich in fruits, vegetables and lean meats and low in salt and simple carbohydrates) and exercise (at least 30 minutes of moderate physical activity daily).  Patient to follow up 6 months  Wyvonne Carda M. Danniella Robben, DO        [1]  Current Outpatient Medications:    Cholecalciferol (VITAMIN D3) 25 MCG (1000 UT) CAPS, Take by mouth., Disp: , Rfl:    hydrochlorothiazide  (HYDRODIURIL ) 25 MG tablet, Take 1 tablet by mouth  once daily for blood pressure, Disp: 90 tablet, Rfl: 0   lisinopril  (ZESTRIL ) 40 MG tablet, Take 1 tablet (40 mg total) by mouth daily., Disp: 90 tablet, Rfl: 3 [2] No Known Allergies  "

## 2024-11-22 NOTE — Patient Instructions (Signed)
 Jardiance sent to help the health of your kidneys.  I have referred you to our clinical pharmacist to help with cost if it is unaffordable.    Chronic Kidney Disease in Adults: What to Know Chronic kidney disease (CKD) is when lasting damage happens to the kidneys slowly over time. The kidneys are two organs that do many important things in the body. These include: Taking waste and extra fluid out of the blood to make pee (urine). Making hormones. Keeping the right amount of fluids and chemicals in the body. A small amount of kidney damage may not cause problems. You must take steps to help keep the kidney damage from getting worse. A lot of damage may cause kidney failure. Kidney failure means the kidneys can no longer work right. What are the causes? Diabetes. High blood pressure. Diseases that affect the heart and blood vessels. Other kidney diseases. Diseases that affect the body's defense system (immune system). A problem with the flow of pee. This may be caused by: Kidney stones. Cancer. An enlarged prostate, in males. A kidney infection or urinary tract infection (UTI) that keeps coming back. What increases the risk? Getting older. The chances of having CKD increase with age. A family history of kidney disease or kidney failure. Having a disease caused by genes. Taking medicines that can harm the kidneys. Being near or having contact with harmful substances. Being very overweight. Using tobacco now or in the past. What are the signs or symptoms? Common symptoms of CKD include: Feeling very tired and having less energy. Swelling of the face, legs, ankles, or feet. Throwing up or feeling like you may throw up. Not wanting to eat as much as normal. Being confused or not able to focus. Twitches and cramps in the leg muscles or other muscles. Dry, itchy skin. Other symptoms may include: Shortness of breath. Trouble sleeping. Making less pee, or making more pee, especially at  night. A taste of metal in your mouth. You may also become anemic. Anemia means there's not enough red blood cells in your blood. You may get symptoms slowly. You may not notice them until the kidney damage gets very bad. How is this diagnosed? CKD may be diagnosed based on: Tests on your blood or pee. Imaging tests, like an ultrasound or a CT scan. A kidney biopsy. For this test, a sample of kidney tissue is removed to be looked at under a microscope. These tests will help to find out how serious the CKD is. How is this treated? Often, there's no cure for CKD. Treatment can help with symptoms and help keep the disease from getting worse. Treatment may include: Treating other problems that are causing your CKD or making it worse. Diet changes. You may need to: Avoid alcohol. Avoid foods that are high in salt, potassium, phosphorous, and protein. Taking medicines for symptoms and to help control other conditions. Dialysis. This treatment gets harmful waste out of your body. It may be needed if you have kidney failure. Follow these instructions at home: Medicines Take your medicines only as told. The amount of some medicines you take may need to be changed. Do not take any new medicines, vitamins, or supplements unless your health care provider says it's okay. These may make kidney damage worse. Lifestyle Do not smoke, vape, or use nicotine or tobacco. If you drink alcohol: Limit how much you have to: 0-1 drink a day if you're female. 0-2 drinks a day if you're female. Know how much alcohol is  in your drink. In the [place] one drink is one 12 oz bottle of beer (355 mL), one 5 oz glass of wine (148 mL), or one 1 oz glass of hard liquor (44 mL). Stay at a healthy weight. If you need help, ask your provider. General instructions  Eat and drink as told. Track your blood pressure at home. Tell your provider about any changes. If you have diabetes, track your blood sugar as told. Exercise at  least 30 minutes a day, 5 days a week. Keep your shots (vaccinations) up to date. Keep all follow-up visits. Your provider may need to change your treatments over time. Where to find support [employer]: [place].com.au Kidney School: kidneyschool.org American Association of Kidney Patients: https://www.miller-montoya.com/ Where to find more information National Kidney Foundation: kidney.org Centers for Disease Control and Prevention. To learn more: Go to Diningcalendar.de. Click Search. Type chronic kidney disease in the search box. Contact a health care provider if: You have new symptoms. You get symptoms of end-stage kidney disease. These include: Headaches. Numbness in your hands or feet. Leg cramps. Easy bruising. Get help right away if: You have a fever. You make less pee than usual. You have pain or bleeding when you pee or poop. You have chest pain. You have shortness of breath. These symptoms may be an emergency. Call 911 right away. Do not wait to see if the symptoms will go away. Do not drive yourself to the hospital. This information is not intended to replace advice given to you by your health care provider. Make sure you discuss any questions you have with your health care provider. Document Revised: 08/29/2023 Document Reviewed: 04/21/2023 Elsevier Patient Education  2024 [employer].

## 2024-11-23 LAB — LIPID PANEL
Chol/HDL Ratio: 2.8 ratio (ref 0.0–4.4)
Cholesterol, Total: 167 mg/dL (ref 100–199)
HDL: 59 mg/dL
LDL Chol Calc (NIH): 86 mg/dL (ref 0–99)
Triglycerides: 123 mg/dL (ref 0–149)
VLDL Cholesterol Cal: 22 mg/dL (ref 5–40)

## 2024-11-23 LAB — CMP14+EGFR
ALT: 21 [IU]/L (ref 0–32)
AST: 18 [IU]/L (ref 0–40)
Albumin: 4.1 g/dL (ref 3.8–4.8)
Alkaline Phosphatase: 57 [IU]/L (ref 49–135)
BUN/Creatinine Ratio: 16 (ref 12–28)
BUN: 19 mg/dL (ref 8–27)
Bilirubin Total: 1.2 mg/dL (ref 0.0–1.2)
CO2: 26 mmol/L (ref 20–29)
Calcium: 10 mg/dL (ref 8.7–10.3)
Chloride: 101 mmol/L (ref 96–106)
Creatinine, Ser: 1.18 mg/dL — ABNORMAL HIGH (ref 0.57–1.00)
Globulin, Total: 2.5 g/dL (ref 1.5–4.5)
Glucose: 88 mg/dL (ref 70–99)
Potassium: 4.5 mmol/L (ref 3.5–5.2)
Sodium: 141 mmol/L (ref 134–144)
Total Protein: 6.6 g/dL (ref 6.0–8.5)
eGFR: 49 mL/min/{1.73_m2} — ABNORMAL LOW

## 2024-11-23 LAB — CBC WITH DIFFERENTIAL/PLATELET
Basophils Absolute: 0 10*3/uL (ref 0.0–0.2)
Basos: 1 %
EOS (ABSOLUTE): 0.3 10*3/uL (ref 0.0–0.4)
Eos: 3 %
Hematocrit: 43.1 % (ref 34.0–46.6)
Hemoglobin: 13.6 g/dL (ref 11.1–15.9)
Immature Grans (Abs): 0 10*3/uL (ref 0.0–0.1)
Immature Granulocytes: 0 %
Lymphocytes Absolute: 2.2 10*3/uL (ref 0.7–3.1)
Lymphs: 25 %
MCH: 29.8 pg (ref 26.6–33.0)
MCHC: 31.6 g/dL (ref 31.5–35.7)
MCV: 94 fL (ref 79–97)
Monocytes Absolute: 0.7 10*3/uL (ref 0.1–0.9)
Monocytes: 8 %
Neutrophils Absolute: 5.4 10*3/uL (ref 1.4–7.0)
Neutrophils: 62 %
Platelets: 278 10*3/uL (ref 150–450)
RBC: 4.57 x10E6/uL (ref 3.77–5.28)
RDW: 12.4 % (ref 11.7–15.4)
WBC: 8.7 10*3/uL (ref 3.4–10.8)

## 2024-11-23 LAB — VITAMIN D 25 HYDROXY (VIT D DEFICIENCY, FRACTURES): Vit D, 25-Hydroxy: 30.3 ng/mL (ref 30.0–100.0)

## 2024-11-23 LAB — MICROALBUMIN / CREATININE URINE RATIO
Creatinine, Urine: 71.5 mg/dL
Microalb/Creat Ratio: <4 mg/g{creat} (ref 0–29)
Microalbumin, Urine: <3 ug/mL

## 2024-11-26 ENCOUNTER — Telehealth: Payer: Self-pay

## 2024-11-26 NOTE — Progress Notes (Signed)
 Care Guide Pharmacy Note  11/26/2024 Name: ARYNN ARMAND MRN: [id number] DOB: [birth year]  Referred By: Jolinda Norene HERO, DO Reason for referral: Complex Care Management (Outreach to schedule with Pharm d )   ALVIN RUBANO is a 74 y.o. year old female who is a primary care patient of Jolinda Norene HERO, DO.  [name] was referred to the pharmacist for assistance related to: DMII  Successful contact was made with the patient to discuss pharmacy services.  Patient declines engagement at this time. Contact information was provided to the patient should they wish to reach out for assistance at a later time.  Jeoffrey Buffalo , RMA     [facility]  [facility] Guide  Direct Dial: [phone]  Website: [email or web address]

## 2024-11-27 ENCOUNTER — Ambulatory Visit: Payer: Self-pay | Admitting: Family Medicine

## 2025-05-23 ENCOUNTER — Other Ambulatory Visit

## 2025-05-23 ENCOUNTER — Ambulatory Visit: Admitting: Family Medicine

## 2025-05-28 ENCOUNTER — Ambulatory Visit: Payer: Self-pay

## 2025-11-24 ENCOUNTER — Encounter: Admitting: Family Medicine
